# Patient Record
Sex: Male | Born: 1990 | Race: Black or African American | Hispanic: No | Marital: Single | Smoking: Current every day smoker
Health system: Southern US, Community
[De-identification: ages and names within clinical notes are randomized; demographics above are authoritative.]

---

## 2011-11-01 ENCOUNTER — Emergency Department (HOSPITAL_COMMUNITY): Payer: Federal, State, Local not specified - PPO

## 2011-11-01 ENCOUNTER — Encounter (HOSPITAL_COMMUNITY): Payer: Self-pay | Admitting: *Deleted

## 2011-11-01 ENCOUNTER — Emergency Department (HOSPITAL_COMMUNITY)
Admission: EM | Admit: 2011-11-01 | Discharge: 2011-11-01 | Disposition: A | Discharge status: Home / Self Care | Payer: Federal, State, Local not specified - PPO | Attending: Emergency Medicine | Admitting: Emergency Medicine

## 2011-11-01 DIAGNOSIS — J45909 Unspecified asthma, uncomplicated: Secondary | ICD-10-CM | POA: Insufficient documentation

## 2011-11-01 DIAGNOSIS — R079 Chest pain, unspecified: Secondary | ICD-10-CM | POA: Insufficient documentation

## 2011-11-01 DIAGNOSIS — J40 Bronchitis, not specified as acute or chronic: Secondary | ICD-10-CM

## 2011-11-01 DIAGNOSIS — R0602 Shortness of breath: Secondary | ICD-10-CM | POA: Insufficient documentation

## 2011-11-01 MED ORDER — PREDNISONE 50 MG PO TABS: 50.0000 mg | ORAL_TABLET | Freq: Every day | ORAL | Status: DC

## 2011-11-01 MED ORDER — ALBUTEROL SULFATE HFA 108 (90 BASE) MCG/ACT IN AERS
2.0000 | INHALATION_SPRAY | Freq: Once | RESPIRATORY_TRACT | Status: AC
  Administered 2011-11-01: 2 via RESPIRATORY_TRACT
  Filled 2011-11-01: qty 6.7

## 2011-11-01 MED ORDER — PREDNISONE 20 MG PO TABS
60.0000 mg | ORAL_TABLET | Freq: Once | ORAL | Status: AC
  Administered 2011-11-01: 60 mg via ORAL
  Filled 2011-11-01: qty 3

## 2011-11-01 NOTE — ED Provider Notes (Signed)
Medical screening examination/treatment/procedure(s) were performed by non-physician practitioner and as supervising physician I was immediately available for consultation/collaboration.  Shanti Eichel T Drew Lips, MD 11/01/11 2325 

## 2011-11-01 NOTE — ED Provider Notes (Signed)
History     CSN: [id number]  Arrival date & time 11/01/11  4098   First MD Initiated Contact with Patient 11/01/11 2107      Chief Complaint  Patient presents with  . Chest Pain    started on z pack Friday,  says he just don't feel good,  coughing up blood tinged yellow phelgm, hurts to take deep breath and cough,    (Consider location/radiation/quality/duration/timing/severity/associated sxs/prior treatment) Patient is a 21 y.o. male presenting with cough. The history is provided by the patient.  Cough This is a new problem. The current episode started more than 1 week ago. The problem occurs constantly. The problem has not changed since onset.The cough is productive of sputum. Associated symptoms include chest pain, chills, sweats, shortness of breath and wheezing. Pertinent negatives include no sore throat.  Pt states he went to see his school NP and was given a prescription for z-pack. States he took it for 2 days now and it is not improving. Not taking any other medications. Subjective fevers at home, nasal congestion. Chest pain with coughing, today saw blood in sputum.  Past Medical History  Diagnosis Date  . Asthma     No past surgical history on file.  No family history on file.  History  Substance Use Topics  . Smoking status: Not on file  . Smokeless tobacco: Not on file  . Alcohol Use: No      Review of Systems  Constitutional: Positive for chills.  HENT: Positive for congestion. Negative for sore throat, neck pain and neck stiffness.   Eyes: Negative.   Respiratory: Positive for cough, shortness of breath and wheezing.   Cardiovascular: Positive for chest pain.  Gastrointestinal: Negative.   Genitourinary: Negative.   Musculoskeletal: Negative.   Skin: Negative.   Neurological: Negative.   Psychiatric/Behavioral: Negative.     Allergies  Review of patient's allergies indicates no known allergies.  Home Medications   Current Outpatient Rx  Name  Route Sig Dispense Refill  . LEVALBUTEROL HCL 1.25 MG/0.5ML IN NEBU Nebulization Take 1 ampule by nebulization every 4 (four) hours as needed. Wheezing      BP 130/62  Pulse 76  Temp(Src) 98.2 F (36.8 C) (Oral)  Resp 20  SpO2 99%  Physical Exam  Nursing note and vitals reviewed. Constitutional: He is oriented to person, place, and time. He appears well-developed and well-nourished. No distress.  HENT:  Head: Normocephalic.  Eyes: Conjunctivae are normal.  Neck: Neck supple.  Cardiovascular: Normal rate, regular rhythm and normal heart sounds.   Pulmonary/Chest: Effort normal. He has wheezes.       Expiratory wheezes in all lung fields  Musculoskeletal: Normal range of motion. He exhibits no edema.  Neurological: He is alert and oriented to person, place, and time.  Skin: Skin is warm and dry.  Psychiatric: He has a normal mood and affect.    ED Course  Procedures (including critical care time)  Labs Reviewed - No data to display Dg Chest 2 View  11/01/2011  *RADIOLOGY REPORT*  Clinical Data: Shortness of breath and chest pain; history of smoking.  CHEST - 2 VIEW  Comparison: None.  Findings: The lungs are well-aerated.  Peribronchial thickening is noted.  There is no evidence of focal opacification, pleural effusion or pneumothorax.  The heart is normal in size; the mediastinal contour is within normal limits.  No acute osseous abnormalities are seen.  IMPRESSION: Peribronchial thickening noted; lungs otherwise clear.  Original Report Authenticated By:  JEFFREY Cherly Hensen, M.D.   PT with cough, wheezing for last week. Started on z-pack 2 days ago. His vital signs are normal. His oxygen sat 99% on RA. He is afebrile. HR normal. Suspect bronchitis. Will treat with inhaler, steroids, continue on z-pack. Will follow up with primary care doctor.    No diagnosis found.    MDM          Lottie Mussel, PA 11/01/11 2204

## 2011-11-01 NOTE — Discharge Instructions (Signed)
Your chest x-ray is consistent with bronchitis. Your vital signs here are all normal. Continue z-pack. Take it with food. Take prednisone as prescribed until all gone. Use inhaler 2 puffs every 4 hrs. Stop smoking. OVer the counter tylenol for body aches and fever. Over the counter cough medications.  Follow up with primary care doctor.   Bronchitis Bronchitis is the body's way of reacting to injury and/or infection (inflammation) of the bronchi. Bronchi are the air tubes that extend from the windpipe into the lungs. If the inflammation becomes severe, it may cause shortness of breath. CAUSES  Inflammation may be caused by:  A virus.   Germs (bacteria).   Dust.   Allergens.   Pollutants and many other irritants.  The cells lining the bronchial tree are covered with tiny hairs (cilia). These constantly beat upward, away from the lungs, toward the mouth. This keeps the lungs free of pollutants. When these cells become too irritated and are unable to do their job, mucus begins to develop. This causes the characteristic cough of bronchitis. The cough clears the lungs when the cilia are unable to do their job. Without either of these protective mechanisms, the mucus would settle in the lungs. Then you would develop pneumonia. Smoking is a common cause of bronchitis and can contribute to pneumonia. Stopping this habit is the single most important thing you can do to help yourself. TREATMENT   Your caregiver may prescribe an antibiotic if the cough is caused by bacteria. Also, medicines that open up your airways make it easier to breathe. Your caregiver may also recommend or prescribe an expectorant. It will loosen the mucus to be coughed up. Only take over-the-counter or prescription medicines for pain, discomfort, or fever as directed by your caregiver.   Removing whatever causes the problem (smoking, for example) is critical to preventing the problem from getting worse.   Cough suppressants may  be prescribed for relief of cough symptoms.   Inhaled medicines may be prescribed to help with symptoms now and to help prevent problems from returning.   For those with recurrent (chronic) bronchitis, there may be a need for steroid medicines.  SEEK IMMEDIATE MEDICAL CARE IF:   During treatment, you develop more pus-like mucus (purulent sputum).   You have a fever.   Your baby is older than 3 months with a rectal temperature of 102 F (38.9 C) or higher.   Your baby is 80 months old or younger with a rectal temperature of 100.4 F (38 C) or higher.   You become progressively more ill.   You have increased difficulty breathing, wheezing, or shortness of breath.  It is necessary to seek immediate medical care if you are elderly or sick from any other disease. MAKE SURE YOU:   Understand these instructions.   Will watch your condition.   Will get help right away if you are not doing well or get worse.  Document Released: 07/27/2005 Document Revised: 07/16/2011 Document Reviewed: 06/05/2008 [facility] Patient Information 2012 [place], [place].

## 2011-11-01 NOTE — ED Notes (Signed)
Pt placed on z pack, doesn't know if he was diagnosed with infection,  Says he is not getting better,  Continued chest congestion, and cough

## 2013-03-16 ENCOUNTER — Encounter (HOSPITAL_COMMUNITY): Payer: Self-pay | Admitting: *Deleted

## 2013-03-16 ENCOUNTER — Emergency Department (HOSPITAL_COMMUNITY)
Admission: EM | Admit: 2013-03-16 | Discharge: 2013-03-16 | Disposition: A | Discharge status: Home / Self Care | Payer: Federal, State, Local not specified - PPO | Source: Home / Self Care

## 2013-03-16 DIAGNOSIS — J039 Acute tonsillitis, unspecified: Secondary | ICD-10-CM

## 2013-03-16 DIAGNOSIS — H6692 Otitis media, unspecified, left ear: Secondary | ICD-10-CM

## 2013-03-16 DIAGNOSIS — H6690 Otitis media, unspecified, unspecified ear: Secondary | ICD-10-CM

## 2013-03-16 MED ORDER — METHYLPREDNISOLONE ACETATE 40 MG/ML IJ SUSP
40.0000 mg | Freq: Once | INTRAMUSCULAR | Status: AC
  Administered 2013-03-16: 40 mg via INTRAMUSCULAR

## 2013-03-16 MED ORDER — METHYLPREDNISOLONE ACETATE 40 MG/ML IJ SUSP
INTRAMUSCULAR | Status: AC
  Filled 2013-03-16: qty 5

## 2013-03-16 MED ORDER — AMOXICILLIN-POT CLAVULANATE 875-125 MG PO TABS: 1.0000 | ORAL_TABLET | Freq: Two times a day (BID) | ORAL | Status: DC

## 2013-03-16 MED ORDER — HYDROCODONE-ACETAMINOPHEN 5-325 MG PO TABS: 1.0000 | ORAL_TABLET | Freq: Three times a day (TID) | ORAL | Status: DC | PRN

## 2013-03-16 NOTE — ED Notes (Signed)
Pt  Reports  Symptoms  Of  sorethroat  With  Pain  When he  Swallows  As  Well  As  l  Earache      Symptoms  X  3  Days       Good  Air  Exchange  Cap  refill is  Brisk

## 2013-03-18 LAB — CULTURE, GROUP A STREP

## 2013-03-21 NOTE — ED Provider Notes (Signed)
CSN: [id number]     Arrival date & time 03/16/13  0844 History     First MD Initiated Contact with Patient 03/16/13 [phone]     Chief Complaint  Patient presents with  . Sore Throat   (Consider location/radiation/quality/duration/timing/severity/associated sxs/prior Treatment) HPI  22 yo bm presented with sore throat and left ear pain.  Symptoms ongoing x 3 days and states that he was feeling fine before sudden onset.  Ear pain aggravated when he tilts his head.  No ear drainage.  Sore throat worsening.  Pain with swallowing, but denies dysphagia or dyspnea.  No complaints of fever, chill, nausea, vomiting, rashes.    Past Medical History  Diagnosis Date  . Asthma    History reviewed. No pertinent past surgical history. No family history on file. History  Substance Use Topics  . Smoking status: Not on file  . Smokeless tobacco: Not on file  . Alcohol Use: No    Review of Systems  Constitutional: Positive for appetite change. Negative for fever, diaphoresis and fatigue.  HENT: Positive for ear pain and neck pain. Negative for hearing loss, nosebleeds, sneezing, neck stiffness, tinnitus and ear discharge.   Eyes: Negative.   Respiratory: Positive for cough. Negative for apnea, chest tightness, shortness of breath, wheezing and stridor.   Cardiovascular: Negative.   Gastrointestinal: Negative.   Endocrine: Negative.   Genitourinary: Negative.   Skin: Negative.   Neurological: Negative.   Psychiatric/Behavioral: Negative.     Allergies  Review of patient's allergies indicates no known allergies.  Home Medications   Current Outpatient Rx  Name  Route  Sig  Dispense  Refill  . amoxicillin-clavulanate (AUGMENTIN) 875-125 MG per tablet   Oral   Take 1 tablet by mouth every 12 (twelve) hours.   20 tablet   0   . HYDROcodone-acetaminophen (NORCO) 5-325 MG per tablet   Oral   Take 1 tablet by mouth every 8 (eight) hours as needed for pain.   20 tablet   0   . levalbuterol  (XOPENEX) 1.25 MG/0.5ML nebulizer solution   Nebulization   Take 1 ampule by nebulization every 4 (four) hours as needed. Wheezing         . predniSONE (DELTASONE) 50 MG tablet   Oral   Take 1 tablet (50 mg total) by mouth daily.   5 tablet   0    BP 141/78  Pulse 97  Temp(Src) 99.5 F (37.5 C) (Oral)  Resp 20  SpO2 100% Physical Exam  Constitutional: He is oriented to person, place, and time. He appears well-nourished.  HENT:  Head: Normocephalic and atraumatic.  Left otitis media with small effusion.  TM intact. Left tonsil red and swollen. No abscess.   No drainage.   Eyes: EOM are normal. Pupils are equal, round, and reactive to light.  Pulmonary/Chest: Effort normal and breath sounds normal. No respiratory distress. He has no wheezes. He has no rales.  Lymphadenopathy:    He has cervical adenopathy.  Neurological: He is alert and oriented to person, place, and time.  Skin: Skin is warm and dry.  Psychiatric: He has a normal mood and affect.    ED Course   Procedures (including critical care time)  Labs Reviewed  CULTURE, GROUP A STREP  POCT RAPID STREP A ([facility] ONLY)   No results found. 1. Otitis media, left   2. Tonsillitis     MDM  Patient given medication listed below.  Will return in 3-5 days if  not better or go to the ED if symptoms worsen.  Voices understanding.  Discharge instructions given and understands what to look for in worsening symptoms.    Meds ordered this encounter  Medications  . methylPREDNISolone acetate (DEPO-MEDROL) injection 40 mg    Sig:   . amoxicillin-clavulanate (AUGMENTIN) 875-125 MG per tablet    Sig: Take 1 tablet by mouth every 12 (twelve) hours.    Dispense:  20 tablet    Refill:  0  . HYDROcodone-acetaminophen (NORCO) 5-325 MG per tablet    Sig: Take 1 tablet by mouth every 8 (eight) hours as needed for pain.    Dispense:  20 tablet    Refill:  0    Zonia Kief, PA-C 03/21/13 1634

## 2013-03-25 NOTE — ED Provider Notes (Signed)
Medical screening examination/treatment/procedure(s) were performed by resident physician or non-physician practitioner and as supervising physician I was immediately available for consultation/collaboration.   Bienvenido Proehl DOUGLAS MD.   Ricquel Foulk D Kyrstal Monterrosa, MD 03/25/13 0950 

## 2014-02-02 ENCOUNTER — Encounter (HOSPITAL_COMMUNITY): Payer: Self-pay | Admitting: Emergency Medicine

## 2014-02-02 ENCOUNTER — Emergency Department (HOSPITAL_COMMUNITY)
Admission: EM | Admit: 2014-02-02 | Discharge: 2014-02-03 | Disposition: A | Discharge status: Home / Self Care | Payer: Federal, State, Local not specified - PPO | Attending: Emergency Medicine | Admitting: Emergency Medicine

## 2014-02-02 DIAGNOSIS — Z23 Encounter for immunization: Secondary | ICD-10-CM | POA: Diagnosis not present

## 2014-02-02 DIAGNOSIS — S51809A Unspecified open wound of unspecified forearm, initial encounter: Secondary | ICD-10-CM | POA: Insufficient documentation

## 2014-02-02 DIAGNOSIS — Y929 Unspecified place or not applicable: Secondary | ICD-10-CM | POA: Insufficient documentation

## 2014-02-02 DIAGNOSIS — W268XXA Contact with other sharp object(s), not elsewhere classified, initial encounter: Secondary | ICD-10-CM | POA: Diagnosis not present

## 2014-02-02 DIAGNOSIS — S51811A Laceration without foreign body of right forearm, initial encounter: Secondary | ICD-10-CM

## 2014-02-02 DIAGNOSIS — J45909 Unspecified asthma, uncomplicated: Secondary | ICD-10-CM | POA: Diagnosis not present

## 2014-02-02 DIAGNOSIS — Y9389 Activity, other specified: Secondary | ICD-10-CM | POA: Insufficient documentation

## 2014-02-02 MED ORDER — TETANUS-DIPHTH-ACELL PERTUSSIS 5-2.5-18.5 LF-MCG/0.5 IM SUSP
0.5000 mL | Freq: Once | INTRAMUSCULAR | Status: AC
  Administered 2014-02-03: 0.5 mL via INTRAMUSCULAR
  Filled 2014-02-02: qty 0.5
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
## 2014-02-03 ENCOUNTER — Emergency Department (HOSPITAL_COMMUNITY): Payer: Federal, State, Local not specified - PPO

## 2014-02-03 MED ORDER — OXYCODONE-ACETAMINOPHEN 5-325 MG PO TABS: 1.0000 | ORAL_TABLET | Freq: Four times a day (QID) | ORAL | Status: DC | PRN

## 2014-02-03 NOTE — ED Notes (Signed)
Patient transported to X-ray 
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
## 2014-02-03 NOTE — ED Notes (Signed)
Pt walked out of room leaving. RN asked pt where he was going and called his name.  Pt continued to walk out.  Told pt we needed to recheck his vitals before he left and pt continued to walk out.

## 2014-02-03 NOTE — ED Notes (Signed)
Pt left without instructions and Rx.

## 2014-02-03 NOTE — Discharge Instructions (Signed)
Sutured Wound Care °Sutures are stitches that can be used to close wounds. Wound care helps prevent pain and infection.  °HOME CARE INSTRUCTIONS  °· Rest and elevate the injured area until all the pain and swelling are gone. °· Only take over-the-counter or prescription medicines for pain, discomfort, or fever as directed by your caregiver. °· After 48 hours, gently wash the area with mild soap and water once a day, or as directed. Rinse off the soap. Pat the area dry with a clean towel. Do not rub the wound. This may cause bleeding. °· Follow your caregiver's instructions for how often to change the bandage (dressing). Stop using a dressing after 2 days or after the wound stops draining. °· If the dressing sticks, moisten it with soapy water and gently remove it. °· Apply ointment on the wound as directed. °· Avoid stretching a sutured wound. °· Drink enough fluids to keep your urine clear or pale yellow. °· Follow up with your caregiver for suture removal as directed. °· Use sunscreen on your wound for the next 3 to 6 months so the scar will not darken. °SEEK IMMEDIATE MEDICAL CARE IF:  °· Your wound becomes red, swollen, hot, or tender. °· You have increasing pain in the wound. °· You have a red streak that extends from the wound. °· There is pus coming from the wound. °· You have a fever. °· You have shaking chills. °· There is a bad smell coming from the wound. °· You have persistent bleeding from the wound. °MAKE SURE YOU:  °· Understand these instructions. °· Will watch your condition. °· Will get help right away if you are not doing well or get worse. °Document Released: 09/03/2004 Document Revised: 10/19/2011 Document Reviewed: 11/30/2010 °[employer]® Patient Information ©2015 [employer]. This information is not intended to replace advice given to you by your health care provider. Make sure you discuss any questions you have with your health care provider. ° °
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
## 2014-02-03 NOTE — ED Provider Notes (Signed)
DAMMEN,PETER S 3:50AM Pt discussed with Attending.  Will assist in pt care with laceration repair.    LACERATION REPAIR Performed by: Angus SellerAMMEN,PETER S Authorized by: Angus SellerAMMEN,PETER S Consent: Verbal consent obtained. Risks and benefits: risks, benefits and alternatives were discussed Consent given by: patient Patient identity confirmed: provided demographic data Prepped and Draped in normal sterile fashion Wound explored  Laceration Location: right forearm  Laceration Length: 8cm  No Foreign Bodies seen or palpated  Anesthesia: local infiltration  Local anesthetic: lidocaine 2% with epinephrine  Anesthetic total: 8 ml  Irrigation method: syringe Amount of cleaning: standard  Closure: sub-q with 4-0 vicryl, skin with 3-0 prolene, skin with 5-0 prolene  Number of sutures: 19  Technique: simple interrupted  Patient tolerance: Patient tolerated the procedure well with no immediate complications.   Angus SellerPeter S Dammen, PA-C 02/03/14 [phone]

## 2014-02-03 NOTE — ED Notes (Signed)
Lorita OfficerPeter PA-C at bedside for suturing

## 2014-02-03 NOTE — ED Provider Notes (Signed)
Medical screening examination/treatment/procedure(s) were performed by non-physician practitioner and as supervising physician I was immediately available for consultation/collaboration.   EKG Interpretation None       Juliet RudeNathan R. Rubin PayorPickering, MD 02/03/14 [phone]

## 2014-02-04 ENCOUNTER — Encounter (HOSPITAL_COMMUNITY): Payer: Self-pay | Admitting: Emergency Medicine

## 2014-02-04 ENCOUNTER — Emergency Department (INDEPENDENT_AMBULATORY_CARE_PROVIDER_SITE_OTHER)
Admission: EM | Admit: 2014-02-04 | Discharge: 2014-02-04 | Disposition: A | Discharge status: Home / Self Care | Payer: Federal, State, Local not specified - PPO | Source: Home / Self Care

## 2014-02-04 DIAGNOSIS — M79609 Pain in unspecified limb: Secondary | ICD-10-CM

## 2014-02-04 DIAGNOSIS — Z5189 Encounter for other specified aftercare: Secondary | ICD-10-CM

## 2014-02-04 DIAGNOSIS — M79601 Pain in right arm: Secondary | ICD-10-CM
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
## 2014-02-04 NOTE — ED Notes (Signed)
Patient presents for follow-up on arm laceration. Was seen in ER and Left without being discharged. Did not receive instructions. Patient is alert and oriented and in no acute distress.

## 2014-02-04 NOTE — Discharge Instructions (Signed)
Please keep the area covered with vaseline and a bandage. You should have your sutures out in 5 days. If you develop worsening pain or redness then please come back sooner.   I hope you feel better soon.   Sincerely,   Dr. Clinton SawyerWilliamson

## 2014-02-07 NOTE — ED Provider Notes (Signed)
Medical screening examination/treatment/procedure(s) were performed by a resident physician and as supervising physician I was immediately available for consultation/collaboration.  Leslee Homeavid Keller, M.D.   Reuben Likesavid C Keller, MD 02/07/14 [phone]

## 2014-02-10 ENCOUNTER — Encounter (HOSPITAL_COMMUNITY): Payer: Self-pay | Admitting: Emergency Medicine

## 2014-02-10 ENCOUNTER — Emergency Department (INDEPENDENT_AMBULATORY_CARE_PROVIDER_SITE_OTHER)
Admission: EM | Admit: 2014-02-10 | Discharge: 2014-02-10 | Disposition: A | Discharge status: Home / Self Care | Payer: Federal, State, Local not specified - PPO | Source: Home / Self Care

## 2014-02-10 DIAGNOSIS — Z4802 Encounter for removal of sutures: Secondary | ICD-10-CM

## 2014-02-10 NOTE — ED Provider Notes (Signed)
CSN: [id number]     Arrival date & time 02/10/14  0909 History   First MD Initiated [id number] with Patient 02/10/14 [phone]     Chief Complaint  Patient presents with  . Wound Check   (Consider location/radiation/quality/duration/timing/severity/associated sxs/prior Treatment) HPI Comments: For suture removal and wound chk. Suffered multipronged laceration to the Right forearm from broken glass 7 d ago. See in the ED and closed with 19 sutures, some deep closure absorbable.  Patient is a 23 y.o. male presenting with wound check.  Wound Check    Past Medical History  Diagnosis Date  . Asthma    History reviewed. No pertinent past surgical history. History reviewed. No pertinent family history. History  Substance Use Topics  . Smoking status: Unknown If Ever Smoked  . Smokeless tobacco: Not on file  . Alcohol Use: No    Review of Systems  All other systems reviewed and are negative.   Allergies  Review of patient's allergies indicates no known allergies.  Home Medications   Prior to Admission medications   Medication Sig Start Date End Date Taking? Authorizing Provider  oxyCODONE-acetaminophen (PERCOCET/ROXICET) 5-325 MG per tablet Take 1-2 tablets by mouth every 6 (six) hours as needed for severe pain. 02/03/14   Juliet RudeNathan R. Pickering, MD   BP 120/77  Pulse 87  Temp(Src) 97.8 F (36.6 C) (Oral)  Resp 16  SpO2 95% Physical Exam  Constitutional: He is oriented to person, place, and time. He appears well-developed and well-nourished. No distress.  Neurological: He is alert and oriented to person, place, and time.  Skin: Skin is warm and dry.  SUtures intact. No sign of infection. Edges well approximated. No purulence. Mild swelling beneath the wounds. Minimal light hyperemia to wound edges.    ED Course  SUTURE REMOVAL Date/Time: 02/10/2014 9:45 AM Performed by: Phineas RealMABE, Louvina Cleary Authorized by: Bradd CanaryKINDL, JAMES D Consent: Verbal consent obtained. Risks and benefits: risks, benefits  and alternatives were discussed Consent given by: patient Patient understanding: patient states understanding of the procedure being performed Patient identity confirmed: verbally with patient Body area: upper extremity Location details: right lower arm Wound Appearance: clean Sutures Removed: 10 Post-removal: dressing applied Patient tolerance: Patient tolerated the procedure well with no immediate complications. Comments: There are 4 inline sutures not removed due to partial separation of the edges. Will leave in for another 3 days. Cleaned with betadine.   (including critical care time) Labs Review Labs Reviewed - No data to display  Imaging Review No results found.   MDM   1. Visit for suture removal     Return 3 d for suture removal.       Hayden Rasmussenavid Tyliek Timberman, NP 02/10/14 [phone]

## 2014-02-10 NOTE — ED Notes (Signed)
Remaining sutures to be removed on Tuesday. Wrapped wound in ACE bandage w bacitracin oint to non stick pad

## 2014-02-10 NOTE — ED Notes (Addendum)
Here for wound check, suture removal. Initial visit 6-26 in ED for laceration. minimal redness at wound

## 2014-02-10 NOTE — ED Provider Notes (Signed)
Medical screening examination/treatment/procedure(s) were performed by resident physician or non-physician practitioner and as supervising physician I was immediately available for consultation/collaboration.   KINDL,JAMES DOUGLAS MD.   James D Kindl, MD 02/10/14 1550 

## 2014-02-10 NOTE — Discharge Instructions (Signed)
Suture Removal, Care After °Refer to this sheet in the next few weeks. These instructions provide you with information on caring for yourself after your procedure. Your health care provider may also give you more specific instructions. Your treatment has been planned according to current medical practices, but problems sometimes occur. Call your health care provider if you have any problems or questions after your procedure. °WHAT TO EXPECT AFTER THE PROCEDURE °After your stitches (sutures) are removed, it is typical to have the following: °· Some discomfort and swelling in the wound area. °· Slight redness in the area. °HOME CARE INSTRUCTIONS  °· If you have skin adhesive strips over the wound area, do not take the strips off. They will fall off on their own in a few days. If the strips remain in place after 14 days, you may remove them. °· Change any bandages (dressings) at least once a day or as directed by your health care provider. If the bandage sticks, soak it off with warm, soapy water. °· Apply cream or ointment only as directed by your health care provider. If using cream or ointment, wash the area with soap and water 2 times a day to remove all the cream or ointment. Rinse off the soap and pat the area dry with a clean towel. °· Keep the wound area dry and clean. If the bandage becomes wet or dirty, or if it develops a bad smell, change it as soon as possible. °· Continue to protect the wound from injury. °· Use sunscreen when out in the sun. New scars become sunburned easily. °SEEK MEDICAL CARE IF: °· You have increasing redness, swelling, or pain in the wound. °· You see pus coming from the wound. °· You have a fever. °· You notice a bad smell coming from the wound or dressing. °· Your wound breaks open (edges not staying together). °Document Released: 04/21/2001 Document Revised: 05/17/2013 Document Reviewed: 03/08/2013 °[employer]® Patient Information ©2015 [employer]. This information is not  intended to replace advice given to you by your health care provider. Make sure you discuss any questions you have with your health care provider. ° °Scar Minimization °You will have a scar anytime you have surgery and a cut is made in the skin or you have something removed from your skin (mole, skin cancer, cyst). Although scars are unavoidable following surgery, there are ways to minimize their appearance. °It is important to follow all the instructions you receive from your caregiver about wound care. How your wound heals will influence the appearance of your scar. If you do not follow the wound care instructions as directed, complications such as infection may occur. Wound instructions include keeping the wound clean, moist, and not letting the wound form a scab. Some people form scars that are raised and lumpy (hypertrophic) or larger than the initial wound (keloidal). °HOME CARE INSTRUCTIONS  °· Follow wound care instructions as directed. °· Keep the wound clean by washing it with soap and water. °· Keep the wound moist with provided antibiotic cream or petroleum jelly until completely healed. Moisten twice a day for about 2 weeks. °· Get stitches (sutures) taken out at the scheduled time. °· Avoid touching or manipulating your wound unless needed. Wash your hands thoroughly before and after touching your wound. °· Follow all restrictions such as limits on exercise or work. This depends on where your scar is located. °· Keep the scar protected from sunburn. Cover the scar with sunscreen/sunblock with SPF 30 or higher. °·   Gently massage the scar using a circular motion to help minimize the appearance of the scar. Do this only after the wound has closed and all the sutures have been removed. °· For hypertrophic or keloidal scars, there are several ways to treat and minimize their appearance. Methods include compression therapy, intralesional corticosteroids, laser therapy, or surgery. These methods are performed  by your caregiver. °Remember that the scar may appear lighter or darker than your normal skin color. This difference in color should even out with time. °SEEK MEDICAL CARE IF:  °· You have a fever. °· You develop signs of infection such as pain, redness, pus, and warmth. °· You have questions or concerns. °Document Released: 01/14/2010 Document Revised: 10/19/2011 Document Reviewed: 01/14/2010 °[employer]® Patient Information ©2015 [employer]. This information is not intended to replace advice given to you by your health care provider. Make sure you discuss any questions you have with your health care provider. ° °

## 2014-02-13 ENCOUNTER — Encounter (HOSPITAL_COMMUNITY): Payer: Self-pay | Admitting: Emergency Medicine

## 2014-02-13 ENCOUNTER — Emergency Department (INDEPENDENT_AMBULATORY_CARE_PROVIDER_SITE_OTHER)
Admission: EM | Admit: 2014-02-13 | Discharge: 2014-02-13 | Disposition: A | Discharge status: Home / Self Care | Payer: Federal, State, Local not specified - PPO | Source: Home / Self Care | Attending: Family Medicine | Admitting: Family Medicine

## 2014-02-13 DIAGNOSIS — Z4802 Encounter for removal of sutures: Secondary | ICD-10-CM

## 2014-02-13 NOTE — Discharge Instructions (Signed)
Return as needed

## 2014-02-13 NOTE — ED Notes (Signed)
Here to get remaining suture removed from right forearm States area feels great  Denies any discharge

## 2014-02-13 NOTE — ED Provider Notes (Signed)
CSN: [id number]     Arrival date & time 02/13/14  [id number] History   First MD Initiated Contact with Patient 02/13/14 [phone]     Chief Complaint  Patient presents with  . Suture / Staple Removal   (Consider location/radiation/quality/duration/timing/severity/associated sxs/prior Treatment) Patient is a 23 y.o. male presenting with suture removal. The history is provided by the patient.  Suture / Staple Removal This is a new problem. Episode onset: seen 7/4 for sr of right forearm lac, no complaints, well healed.    Past Medical History  Diagnosis Date  . Asthma    History reviewed. No pertinent past surgical history. History reviewed. No pertinent family history. History  Substance Use Topics  . Smoking status: Unknown If Ever Smoked  . Smokeless tobacco: Not on file  . Alcohol Use: No    Review of Systems  Allergies  Review of patient's allergies indicates no known allergies.  Home Medications   Prior to Admission medications   Medication Sig Start Date End Date Taking? Authorizing Provider  oxyCODONE-acetaminophen (PERCOCET/ROXICET) 5-325 MG per tablet Take 1-2 tablets by mouth every 6 (six) hours as needed for severe pain. 02/03/14   Juliet RudeNathan R. Pickering, MD   BP 118/79  Temp(Src) 97.5 F (36.4 C) (Oral)  Resp 16  Ht 5\' 9"  (1.753 m)  Wt 150 lb (68.04 kg)  BMI 22.14 kg/m2  SpO2 97% Physical Exam  Nursing note and vitals reviewed. Constitutional: He is oriented to person, place, and time. He appears well-developed and well-nourished.  Neurological: He is alert and oriented to person, place, and time.  Skin: Skin is warm and dry.  Right arm lac well  Healed, 4 stitches removed.    ED Course  Procedures (including critical care time) Labs Review Labs Reviewed - No data to display  Imaging Review No results found.   MDM   1. Encounter for removal of sutures        Linna HoffJames D Tristan Proto, MD 02/13/14 1125

## 2014-12-06 ENCOUNTER — Emergency Department (INDEPENDENT_AMBULATORY_CARE_PROVIDER_SITE_OTHER)
Admission: EM | Admit: 2014-12-06 | Discharge: 2014-12-06 | Disposition: A | Discharge status: Home / Self Care | Payer: Federal, State, Local not specified - PPO | Source: Home / Self Care | Attending: Emergency Medicine | Admitting: Emergency Medicine

## 2014-12-06 ENCOUNTER — Encounter (HOSPITAL_COMMUNITY): Payer: Self-pay | Admitting: *Deleted

## 2014-12-06 DIAGNOSIS — H6092 Unspecified otitis externa, left ear: Secondary | ICD-10-CM

## 2014-12-06 DIAGNOSIS — H6982 Other specified disorders of Eustachian tube, left ear: Secondary | ICD-10-CM

## 2014-12-06 MED ORDER — CIPROFLOXACIN-DEXAMETHASONE 0.3-0.1 % OT SUSP: 4.0000 [drp] | Freq: Two times a day (BID) | OTIC | Status: AC

## 2014-12-06 MED ORDER — FLUTICASONE PROPIONATE 50 MCG/ACT NA SUSP: 2.0000 | Freq: Every day | NASAL | Status: AC

## 2014-12-06 MED ORDER — CETIRIZINE HCL 10 MG PO TABS: 10.0000 mg | ORAL_TABLET | Freq: Every day | ORAL | Status: AC

## 2014-12-06 NOTE — ED Notes (Signed)
Left ear pain for 3 days, pain to the left side of head, cough.  Denies placing anything in left ear.  Pain in ear with swallowing.

## 2014-12-06 NOTE — ED Notes (Deleted)
Pt  Reports  She  Has  Had  A  Ringing in  Both   Ears   For  About  6  Months      She  Reports     Pressure  Sensation in the affected  Ears     That became  Worse  yest     Pt is  Sitting  pright on the  Exam table  Speaking in  Complete  sentances  And  Is  In no  Acute  Distress

## 2014-12-06 NOTE — Discharge Instructions (Signed)
You have an infection in the left ear canal. Your ear is also not draining like it's supposed to. Takes cetirizine 1 pill daily for 1 week. Use Flonase daily for 1 week. Use the eardrops in the left ear twice a day for 1 week. You can alternate Tylenol and ibuprofen every 4 hours for pain. You should see improvement in 2-3 days. Follow-up as needed.

## 2014-12-06 NOTE — ED Provider Notes (Signed)
CSN: [id number]     Arrival date & time 12/06/14  1100 History   First MD Initiated Contact with Patient 12/06/14 1302     Chief Complaint  Patient presents with  . Otalgia   (Consider location/radiation/quality/duration/timing/severity/associated sxs/prior Treatment) HPI He is a [age] year old man here for evaluation of left ear pain. It started 3 days ago and has gradually been getting worse. The pain is spreading into the left side of his head. He has some discomfort in the left side of his throat with swallowing. He has not tried any medications. No fevers. He denies any runny nose or stuffiness.  Past Medical History  Diagnosis Date  . Asthma    History reviewed. No pertinent past surgical history. History reviewed. No pertinent family history. History  Substance Use Topics  . Smoking status: Unknown If Ever Smoked  . Smokeless tobacco: Not on file  . Alcohol Use: No    Review of Systems As in history of present illness Allergies  Review of patient's allergies indicates no known allergies.  Home Medications   Prior to Admission medications   Medication Sig Start Date End Date Taking? Authorizing Provider  cetirizine (ZYRTEC) 10 MG tablet Take 1 tablet (10 mg total) by mouth daily. 12/06/14   Charm RingsErin J Tekla Malachowski, MD  ciprofloxacin-dexamethasone (CIPRODEX) otic suspension Place 4 drops into the left ear 2 (two) times daily. For 7 days 12/06/14   Charm RingsErin J Adalbert Alberto, MD  fluticasone [facility](FLONASE) 50 MCG/ACT nasal spray Place 2 sprays into both nostrils daily. 12/06/14   Charm RingsErin J Dare Spillman, MD   BP 138/80 mmHg  Pulse 68  Temp(Src) 97.7 F (36.5 C) (Oral)  Resp 18  SpO2 99% Physical Exam  Constitutional: He is oriented to person, place, and time. He appears well-developed and well-nourished. No distress.  HENT:  Right Ear: Tympanic membrane normal.  Left Ear: There is swelling (Of ear canal). Tympanic membrane is retracted. A middle ear effusion (clear fluid) is present.  Nose: Mucosal edema  present. No rhinorrhea.  Mouth/Throat: No oropharyngeal exudate.    Eyes: Conjunctivae are normal.  Neck: Neck supple.  Cardiovascular: Normal rate.   Pulmonary/Chest: Effort normal.  Neurological: He is alert and oriented to person, place, and time.    ED Course  Procedures (including critical care time) Labs Review Labs Reviewed - No data to display  Imaging Review No results found.   MDM   1. Eustachian tube dysfunction, left   2. Otitis externa, left    Treat with Zyrtec, Flonase, Ciprodex eardrops. Follow-up if no improvement in 1 week.    Charm RingsErin J Barlow Harrison, MD 12/06/14 [phone]

## 2014-12-24 ENCOUNTER — Emergency Department (INDEPENDENT_AMBULATORY_CARE_PROVIDER_SITE_OTHER)
Admission: EM | Admit: 2014-12-24 | Discharge: 2014-12-24 | Disposition: A | Discharge status: Home / Self Care | Payer: Federal, State, Local not specified - PPO | Source: Home / Self Care | Attending: Family Medicine | Admitting: Family Medicine

## 2014-12-24 ENCOUNTER — Encounter (HOSPITAL_COMMUNITY): Payer: Self-pay | Admitting: Emergency Medicine

## 2014-12-24 DIAGNOSIS — J029 Acute pharyngitis, unspecified: Secondary | ICD-10-CM

## 2014-12-24 DIAGNOSIS — J301 Allergic rhinitis due to pollen: Secondary | ICD-10-CM | POA: Diagnosis not present

## 2014-12-24 DIAGNOSIS — J4531 Mild persistent asthma with (acute) exacerbation: Secondary | ICD-10-CM

## 2014-12-24 LAB — POCT RAPID STREP A: STREPTOCOCCUS, GROUP A SCREEN (DIRECT): NEGATIVE

## 2014-12-24 MED ORDER — PREDNISONE 20 MG PO TABS: ORAL_TABLET | ORAL | Status: DC

## 2014-12-24 MED ORDER — ALBUTEROL SULFATE HFA 108 (90 BASE) MCG/ACT IN AERS: 2.0000 | INHALATION_SPRAY | RESPIRATORY_TRACT | Status: AC | PRN

## 2014-12-24 MED ORDER — AMOXICILLIN 500 MG PO CAPS: 1000.0000 mg | ORAL_CAPSULE | Freq: Two times a day (BID) | ORAL | Status: DC

## 2014-12-24 NOTE — Discharge Instructions (Signed)
Allergic Rhinitis Use Flonase nasal spray Zyrtec 10 mg daily Drink plenty of fluids stay well-hydrated Sudafed PE 10 mg every 4 hours as needed for congestion Prednisone and albuterol HFA as prescribed Allergic rhinitis is when the mucous membranes in the nose respond to allergens. Allergens are particles in the air that cause your body to have an allergic reaction. This causes you to release allergic antibodies. Through a chain of events, these eventually cause you to release histamine into the blood stream. Although meant to protect the body, it is this release of histamine that causes your discomfort, such as frequent sneezing, congestion, and an itchy, runny nose.  CAUSES  Seasonal allergic rhinitis (hay fever) is caused by pollen allergens that may come from grasses, trees, and weeds. Year-round allergic rhinitis (perennial allergic rhinitis) is caused by allergens such as house dust mites, pet dander, and mold spores.  SYMPTOMS   Nasal stuffiness (congestion).  Itchy, runny nose with sneezing and tearing of the eyes. DIAGNOSIS  Your health care provider can help you determine the allergen or allergens that trigger your symptoms. If you and your health care provider are unable to determine the allergen, skin or blood testing may be used. TREATMENT  Allergic rhinitis does not have a cure, but it can be controlled by:  Medicines and allergy shots (immunotherapy).  Avoiding the allergen. Hay fever may often be treated with antihistamines in pill or nasal spray forms. Antihistamines block the effects of histamine. There are over-the-counter medicines that may help with nasal congestion and swelling around the eyes. Check with your health care provider before taking or giving this medicine.  If avoiding the allergen or the medicine prescribed do not work, there are many new medicines your health care provider can prescribe. Stronger medicine may be used if initial measures are ineffective.  Desensitizing injections can be used if medicine and avoidance does not work. Desensitization is when a patient is given ongoing shots until the body becomes less sensitive to the allergen. Make sure you follow up with your health care provider if problems continue. HOME CARE INSTRUCTIONS It is not possible to completely avoid allergens, but you can reduce your symptoms by taking steps to limit your exposure to them. It helps to know exactly what you are allergic to so that you can avoid your specific triggers. SEEK MEDICAL CARE IF:   You have a fever.  You develop a cough that does not stop easily (persistent).  You have shortness of breath.  You start wheezing.  Symptoms interfere with normal daily activities. Document Released: 04/21/2001 Document Revised: 08/01/2013 Document Reviewed: 04/03/2013 [facility] Patient Information 2015 [place], [place]. This information is not intended to replace advice given to you by your health care provider. Make sure you discuss any questions you have with your health care provider.  Asthma, Acute Bronchospasm Acute bronchospasm caused by asthma is also referred to as an asthma attack. Bronchospasm means your air passages become narrowed. The narrowing is caused by inflammation and tightening of the muscles in the air tubes (bronchi) in your lungs. This can make it hard to breathe or cause you to wheeze and cough. CAUSES Possible triggers are:  Animal dander from the skin, hair, or feathers of animals.  Dust mites contained in house dust.  Cockroaches.  Pollen from trees or grass.  Mold.  Cigarette or tobacco smoke.  Air pollutants such as dust, household cleaners, hair sprays, aerosol sprays, paint fumes, strong chemicals, or strong odors.  Cold air or weather  changes. Cold air may trigger inflammation. Winds increase molds and pollens in the air.  Strong emotions such as crying or laughing hard.  Stress.  Certain medicines such as aspirin  or beta-blockers.  Sulfites in foods and drinks, such as dried fruits and wine.  Infections or inflammatory conditions, such as a flu, cold, or inflammation of the nasal membranes (rhinitis).  Gastroesophageal reflux disease (GERD). GERD is a condition where stomach acid backs up into your esophagus.  Exercise or strenuous activity. SIGNS AND SYMPTOMS   Wheezing.  Excessive coughing, particularly at night.  Chest tightness.  Shortness of breath. DIAGNOSIS  Your health care provider will ask you about your medical history and perform a physical exam. A chest X-ray or blood testing may be performed to look for other causes of your symptoms or other conditions that may have triggered your asthma attack. TREATMENT  Treatment is aimed at reducing inflammation and opening up the airways in your lungs. Most asthma attacks are treated with inhaled medicines. These include quick relief or rescue medicines (such as bronchodilators) and controller medicines (such as inhaled corticosteroids). These medicines are sometimes given through an inhaler or a nebulizer. Systemic steroid medicine taken by mouth or given through an IV tube also can be used to reduce the inflammation when an attack is moderate or severe. Antibiotic medicines are only used if a bacterial infection is present.  HOME CARE INSTRUCTIONS   Rest.  Drink plenty of liquids. This helps the mucus to remain thin and be easily coughed up. Only use caffeine in moderation and do not use alcohol until you have recovered from your illness.  Do not smoke. Avoid being exposed to secondhand smoke.  You play a critical role in keeping yourself in good health. Avoid exposure to things that cause you to wheeze or to have breathing problems.  Keep your medicines up-to-date and available. Carefully follow your health care provider's treatment plan.  Take your medicine exactly as prescribed.  When pollen or pollution is bad, keep windows  closed and use an air conditioner or go to places with air conditioning.  Asthma requires careful medical care. See your health care provider for a follow-up as advised. If you are more than [redacted] weeks pregnant and you were prescribed any new medicines, let your obstetrician know about the visit and how you are doing. Follow up with your health care provider as directed.  After you have recovered from your asthma attack, make an appointment with your outpatient doctor to talk about ways to reduce the likelihood of future attacks. If you do not have a doctor who manages your asthma, make an appointment with a primary care doctor to discuss your asthma. SEEK IMMEDIATE MEDICAL CARE IF:   You are getting worse.  You have trouble breathing. If severe, call your local emergency services (911 in the [place]  You develop chest pain or discomfort.  You are vomiting.  You are not able to keep fluids down.  You are coughing up yellow, green, brown, or bloody sputum.  You have a fever and your symptoms suddenly get worse.  You have trouble swallowing. MAKE SURE YOU:   Understand these instructions.  Will watch your condition.  Will get help right away if you are not doing well or get worse. Document Released: 11/11/2006 Document Revised: 08/01/2013 Document Reviewed: 02/01/2013 [facility] Patient Information 2015 [place], [place]. This information is not intended to replace advice given to you by your health care provider. Make sure you discuss  any questions you have with your health care provider.  Pharyngitis Pharyngitis is a sore throat (pharynx). There is redness, pain, and swelling of your throat. HOME CARE   Drink enough fluids to keep your pee (urine) clear or pale yellow.  Only take medicine as told by your doctor.  You may get sick again if you do not take medicine as told. Finish your medicines, even if you start to feel better.  Do not take aspirin.  Rest.  Rinse your mouth  (gargle) with salt water ( tsp of salt per 1 qt of water) every 1-2 hours. This will help the pain.  If you are not at risk for choking, you can suck on hard candy or sore throat lozenges. GET HELP IF:  You have large, tender lumps on your neck.  You have a rash.  You cough up green, yellow-brown, or bloody spit. GET HELP RIGHT AWAY IF:   You have a stiff neck.  You drool or cannot swallow liquids.  You throw up (vomit) or are not able to keep medicine or liquids down.  You have very bad pain that does not go away with medicine.  You have problems breathing (not from a stuffy nose). MAKE SURE YOU:   Understand these instructions.  Will watch your condition.  Will get help right away if you are not doing well or get worse. Document Released: 01/13/2008 Document Revised: 05/17/2013 Document Reviewed: 04/03/2013 [facility] Patient Information 2015 [place], [place]. This information is not intended to replace advice given to you by your health care provider. Make sure you discuss any questions you have with your health care provider.  Sore Throat A sore throat is a painful, burning, sore, or scratchy feeling of the throat. There may be pain or tenderness when swallowing or talking. You may have other symptoms with a sore throat. These include coughing, sneezing, fever, or a swollen neck. A sore throat is often the first sign of another sickness. These sicknesses may include a cold, flu, strep throat, or an infection called mono. Most sore throats go away without medical treatment.  HOME CARE   Only take medicine as told by your doctor.  Drink enough fluids to keep your pee (urine) clear or pale yellow.  Rest as needed.  Try using throat sprays, lozenges, or suck on hard candy (if older than 4 years or as told).  Sip warm liquids, such as broth, herbal tea, or warm water with honey. Try sucking on frozen ice pops or drinking cold liquids.  Rinse the mouth (gargle) with salt  water. Mix 1 teaspoon salt with 8 ounces of water.  Do not smoke. Avoid being around others when they are smoking.  Put a humidifier in your bedroom at night to moisten the air. You can also turn on a hot shower and sit in the bathroom for 5-10 minutes. Be sure the bathroom door is closed. GET HELP RIGHT AWAY IF:   You have trouble breathing.  You cannot swallow fluids, soft foods, or your spit (saliva).  You have more puffiness (swelling) in the throat.  Your sore throat does not get better in 7 days.  You feel sick to your stomach (nauseous) and throw up (vomit).  You have a fever or lasting symptoms for more than 2-3 days.  You have a fever and your symptoms suddenly get worse. MAKE SURE YOU:   Understand these instructions.  Will watch your condition.  Will get help right away if you are not  doing well or get worse. Document Released: 05/05/2008 Document Revised: 04/20/2012 Document Reviewed: 04/03/2012 [facility] Patient Information 2015 [place], [place]. This information is not intended to replace advice given to you by your health care provider. Make sure you discuss any questions you have with your health care provider.

## 2014-12-24 NOTE — ED Notes (Signed)
C/o sore throat  For three days  States he has a fever, diarrhea, bloody mucous and coughing nyquil and dayquil was used as tx

## 2014-12-24 NOTE — ED Provider Notes (Signed)
CSN: [id number]     Arrival date & time 12/24/14  1744 History   First MD Initiated Contact with Patient 12/24/14 1843     Chief Complaint  Patient presents with  . Sore Throat   (Consider location/radiation/quality/duration/timing/severity/associated sxs/prior Treatment) HPI Comments: 24 year old male is complaining of a tight feeling in his throat. Decreased appetite, PND, cough, night sweats for 4 days. His temperature on arrival is 101.4. He has a history of asthma.  Patient is a 24 y.o. male presenting with pharyngitis.  Sore Throat Pertinent negatives include no shortness of breath.    Past Medical History  Diagnosis Date  . Asthma    History reviewed. No pertinent past surgical history. History reviewed. No pertinent family history. History  Substance Use Topics  . Smoking status: Unknown If Ever Smoked  . Smokeless tobacco: Not on file  . Alcohol Use: No    Review of Systems  Constitutional: Positive for fever and activity change. Negative for diaphoresis and fatigue.  HENT: Positive for congestion, postnasal drip, rhinorrhea, sore throat and trouble swallowing. Negative for ear pain and facial swelling.   Eyes: Negative for pain, discharge and redness.  Respiratory: Positive for cough and wheezing. Negative for chest tightness and shortness of breath.   Cardiovascular: Negative.   Gastrointestinal: Negative.   Musculoskeletal: Negative.  Negative for neck pain and neck stiffness.  Skin: Negative for rash.  Neurological: Negative.     Allergies  Review of patient's allergies indicates no known allergies.  Home Medications   Prior to Admission medications   Medication Sig Start Date End Date Taking? Authorizing Provider  albuterol (PROVENTIL HFA;VENTOLIN HFA) 108 (90 BASE) MCG/ACT inhaler Inhale 2 puffs into the lungs every 4 (four) hours as needed for wheezing or shortness of breath. 12/24/14   Hayden Rasmussenavid Nysir Fergusson, NP  amoxicillin (AMOXIL) 500 MG capsule Take 2 capsules  (1,000 mg total) by mouth 2 (two) times daily. 12/24/14   Hayden Rasmussenavid Zaylyn Bergdoll, NP  cetirizine (ZYRTEC) 10 MG tablet Take 1 tablet (10 mg total) by mouth daily. 12/06/14   Charm RingsErin J Honig, MD  ciprofloxacin-dexamethasone (CIPRODEX) otic suspension Place 4 drops into the left ear 2 (two) times daily. For 7 days 12/06/14   Charm RingsErin J Honig, MD  fluticasone [facility](FLONASE) 50 MCG/ACT nasal spray Place 2 sprays into both nostrils daily. 12/06/14   Charm RingsErin J Honig, MD  predniSONE (DELTASONE) 20 MG tablet Take 3 tabs po on first day, 2 tabs second day, 2 tabs third day, 1 tab fourth day, 1 tab 5th day. Take with food. 12/24/14   Hayden Rasmussenavid Rider Ermis, NP   BP 128/86 mmHg  Pulse 102  Temp(Src) 101.4 F (38.6 C) (Oral)  Resp 18  SpO2 96% Physical Exam  Constitutional: He is oriented to person, place, and time. He appears well-developed and well-nourished. No distress.  HENT:  Bilateral TMs are normal Oropharynx is beefy red with involvement of small tonsillar tissue. Few exudates.  Eyes: Conjunctivae and EOM are normal.  Neck: Normal range of motion. Neck supple.  Cardiovascular: Normal rate, regular rhythm and normal heart sounds.   Pulmonary/Chest: Effort normal. He has wheezes. He has no rales.  Musculoskeletal: He exhibits no edema.  Lymphadenopathy:    He has cervical adenopathy.  Neurological: He is alert and oriented to person, place, and time.  Skin: Skin is warm and dry.  Psychiatric: He has a normal mood and affect.  Nursing note and vitals reviewed.   ED Course  Procedures (including critical care time) Labs Review Labs  Reviewed  POCT RAPID STREP A ([facility] ONLY)   Results for orders placed or performed during the hospital encounter of 12/24/14  POCT rapid strep A [facility])  Result Value Ref Range   Streptococcus, Group A Screen (Direct) NEGATIVE NEGATIVE    Imaging Review No results found.   MDM   1. Allergic rhinitis due to pollen   2. Exudative pharyngitis   3. Asthma exacerbation attacks,  mild persistent    Use Flonase nasal spray Zyrtec 10 mg daily Drink plenty of fluids stay well-hydrated Sudafed PE 10 mg every 4 hours as needed for congestion Prednisone and albuterol HFA as prescribed Amoxicillin as directed     Hayden Rasmussenavid Quientin Jent, NP 12/24/14 1929

## 2014-12-27 LAB — CULTURE, GROUP A STREP: STREP A CULTURE: NEGATIVE

## 2015-06-01 ENCOUNTER — Encounter (HOSPITAL_COMMUNITY): Payer: Self-pay | Admitting: Oncology

## 2015-06-01 ENCOUNTER — Emergency Department (HOSPITAL_COMMUNITY): Payer: Federal, State, Local not specified - PPO

## 2015-06-01 ENCOUNTER — Emergency Department (HOSPITAL_COMMUNITY)
Admission: EM | Admit: 2015-06-01 | Discharge: 2015-06-02 | Disposition: A | Discharge status: Home / Self Care | Payer: Federal, State, Local not specified - PPO | Attending: Emergency Medicine | Admitting: Emergency Medicine

## 2015-06-01 DIAGNOSIS — B9789 Other viral agents as the cause of diseases classified elsewhere: Secondary | ICD-10-CM

## 2015-06-01 DIAGNOSIS — Z720 Tobacco use: Secondary | ICD-10-CM | POA: Insufficient documentation

## 2015-06-01 DIAGNOSIS — Z792 Long term (current) use of antibiotics: Secondary | ICD-10-CM | POA: Insufficient documentation

## 2015-06-01 DIAGNOSIS — Z7951 Long term (current) use of inhaled steroids: Secondary | ICD-10-CM | POA: Insufficient documentation

## 2015-06-01 DIAGNOSIS — Z79899 Other long term (current) drug therapy: Secondary | ICD-10-CM | POA: Insufficient documentation

## 2015-06-01 DIAGNOSIS — J069 Acute upper respiratory infection, unspecified: Secondary | ICD-10-CM

## 2015-06-01 DIAGNOSIS — B349 Viral infection, unspecified: Secondary | ICD-10-CM

## 2015-06-01 DIAGNOSIS — J45901 Unspecified asthma with (acute) exacerbation: Secondary | ICD-10-CM | POA: Insufficient documentation

## 2015-06-01 DIAGNOSIS — J9801 Acute bronchospasm: Secondary | ICD-10-CM

## 2015-06-01 MED ORDER — IPRATROPIUM BROMIDE 0.02 % IN SOLN
0.5000 mg | Freq: Once | RESPIRATORY_TRACT | Status: AC
  Administered 2015-06-01: 0.5 mg via RESPIRATORY_TRACT
  Filled 2015-06-01: qty 2.5

## 2015-06-01 MED ORDER — FLUTICASONE PROPIONATE 50 MCG/ACT NA SUSP
2.0000 | Freq: Every day | NASAL | Status: DC
  Administered 2015-06-02: 2 via NASAL
  Filled 2015-06-01: qty 16

## 2015-06-01 MED ORDER — PREDNISONE 20 MG PO TABS
60.0000 mg | ORAL_TABLET | Freq: Once | ORAL | Status: AC
  Administered 2015-06-01: 60 mg via ORAL
  Filled 2015-06-01: qty 3

## 2015-06-01 MED ORDER — ALBUTEROL SULFATE (2.5 MG/3ML) 0.083% IN NEBU
5.0000 mg | INHALATION_SOLUTION | Freq: Once | RESPIRATORY_TRACT | Status: AC
  Administered 2015-06-01: 5 mg via RESPIRATORY_TRACT
  Filled 2015-06-01: qty 6

## 2015-06-01 NOTE — ED Notes (Addendum)
Pt w/ hx of asthma began having SOB and cough yesterday.  Pt presents d/t progressive SOB.  Pt is able to speak in full sentences and RR is even and unlabored.  Denies pain.  Pt used 2 albuterol neb tx PTA.

## 2015-06-01 NOTE — ED Provider Notes (Signed)
CSN: [id number]     Arrival date & time 06/01/15  2217 History  By signing my name below, I, Peter Greer, attest that this documentation has been prepared under the direction and in the presence of [employer], PA-C. Electronically Signed: [name], ED Scribe. 06/01/2015. 10:59 PM.    Chief Complaint  Patient presents with  . Asthma   The history is provided by the patient. No language interpreter was used.    HPI Comments: Peter Greer is a 24 y.o. male with h/o asthma who presents to the Emergency Department complaining of an exacerbation of asthma onset yesterday and worsened today. He states associated sore throat (onset first), congestion, rhinorrhea, chest tightness, productive cough with occasional blood streaking. He notes his phlegm yesterday was green and states it is yellow today. He states he has taken albuterol with temporary relief. He has also taken OTC cold medication with mild relief. He also notes that he took 2 Prednisone yesterday leftover from a recent prescription. Pt is a current smoker. Pt states sick contact with son who also has a cough.   Past Medical History  Diagnosis Date  . Asthma    History reviewed. No pertinent past surgical history. No family history on file. Social History  Substance Use Topics  . Smoking status: Current Every Day Smoker -- 0.50 packs/day  . Smokeless tobacco: Never Used  . Alcohol Use: No    Review of Systems  HENT: Positive for congestion, rhinorrhea and sore throat.   Respiratory: Positive for cough and chest tightness.   All other systems reviewed and are negative.  Allergies  Review of patient's allergies indicates no known allergies.  Home Medications   Prior to Admission medications   Medication Sig Start Date End Date Taking? Authorizing Provider  albuterol (PROVENTIL HFA;VENTOLIN HFA) 108 (90 BASE) MCG/ACT inhaler Inhale 2 puffs into the lungs every 4 (four) hours as needed for wheezing or shortness of breath.  12/24/14   Hayden Rasmussen, NP  albuterol (PROVENTIL) (2.5 MG/3ML) 0.083% nebulizer solution Take 3 mLs (2.5 mg total) by nebulization every 4 (four) hours as needed for wheezing or shortness of breath. 06/02/15   Antony Madura, PA-C  amoxicillin (AMOXIL) 500 MG capsule Take 2 capsules (1,000 mg total) by mouth 2 (two) times daily. 12/24/14   Hayden Rasmussen, NP  benzonatate (TESSALON) 100 MG capsule Take 1 capsule (100 mg total) by mouth every 8 (eight) hours. 06/02/15   Antony Madura, PA-C  cetirizine (ZYRTEC) 10 MG tablet Take 1 tablet (10 mg total) by mouth daily. 12/06/14   Charm Rings, MD  ciprofloxacin-dexamethasone (CIPRODEX) otic suspension Place 4 drops into the left ear 2 (two) times daily. For 7 days 12/06/14   Charm Rings, MD  fluticasone [facility]) 50 MCG/ACT nasal spray Place 2 sprays into both nostrils daily. 12/06/14   Charm Rings, MD  predniSONE (DELTASONE) 20 MG tablet Take 2 tablets (40 mg total) by mouth daily. 06/02/15   Antony Madura, PA-C   BP 133/81 mmHg  Pulse 97  Temp(Src) 98.6 F (37 C) (Oral)  Resp 18  Ht  (1.753 m)  Wt 256 lb (116.121 kg)  BMI 37.79 kg/m2  SpO2 96%   Physical Exam  Constitutional: He is oriented to person, place, and time. He appears well-developed and well-nourished. No distress.  Nontoxic/nonseptic appearing  HENT:  Head: Normocephalic and atraumatic.  Mouth/Throat: Oropharynx is clear and moist.  Audible nasal congestion. Patient tolerating secretions without difficulty.  Eyes: Conjunctivae and  EOM are normal. No scleral icterus.  Neck: Normal range of motion.  No nuchal rigidity or meningismus  Cardiovascular: Normal rate, regular rhythm and intact distal pulses.   Pulmonary/Chest: Effort normal. No respiratory distress. He has wheezes. He has no rales.  Chest expansion symmetric. No accessory muscle use. Faint expiratory wheezing heard in the bilateral lower lung fields. No rales or rhonchi.  Musculoskeletal: Normal range of motion.   Neurological: He is alert and oriented to person, place, and time. He exhibits normal muscle tone. Coordination normal.  GCS 15. Patient moving all extremities. He is ambulatory with steady gait.  Skin: Skin is warm and dry. No rash noted. He is not diaphoretic. No erythema. No pallor.  Psychiatric: He has a normal mood and affect. His behavior is normal.  Nursing note and vitals reviewed.   ED Course  Procedures (including critical care time) DIAGNOSTIC STUDIES: Oxygen Saturation is 99% on RA, normal by my interpretation.    COORDINATION OF CARE: 10:57 PM Discussed treatment plan with pt at bedside and pt agreed to plan.   Imaging Review Dg Chest 2 View  06/02/2015  CLINICAL DATA:  Initial valuation for acute shortness of breath, cough. History of asthma. EXAM: CHEST  2 VIEW COMPARISON:  Prior radiograph from 11/01/2011. FINDINGS: The cardiac and mediastinal silhouettes are stable in size and contour, and remain within normal limits. The lungs are normally inflated. Mild diffuse peribronchial thickening. No airspace consolidation, pleural effusion, or pulmonary edema is identified. There is no pneumothorax. No acute osseous abnormality identified. IMPRESSION: Mild diffuse peribronchial thickening, which can be seen in the setting of asthma/ reactive airways disease. No other active cardiopulmonary disease. Electronically Signed   By: Rise Mu M.D.   On: 06/02/2015 00:16   I have personally reviewed and evaluated these images as part of my medical decision-making.   Medications  fluticasone (FLONASE) 50 MCG/ACT nasal spray 2 spray (2 sprays Each Nare Given 06/02/15 0133)  predniSONE (DELTASONE) tablet 60 mg (60 mg Oral Given 06/01/15 2313)  ipratropium (ATROVENT) nebulizer solution 0.5 mg (0.5 mg Nebulization Given 06/01/15 2313)  albuterol (PROVENTIL) (2.5 MG/3ML) 0.083% nebulizer solution 5 mg (5 mg Nebulization Given 06/01/15 2313)  albuterol (PROVENTIL) (2.5 MG/3ML)  0.083% nebulizer solution 5 mg (5 mg Nebulization Given 06/02/15 0034)  ipratropium (ATROVENT) nebulizer solution 0.5 mg (0.5 mg Nebulization Given 06/02/15 0034)    MDM   Final diagnoses:  Viral URI with cough  Acute bronchospasm due to viral infection    Pt CXR negative for acute infiltrate. Patients symptoms are consistent with URI, likely viral etiology. Discussed that antibiotics are not indicated for viral infections. Care in ED included DuoNeb x 2, oral steroids, and Flonase. Patient ambulatory in the ED without hypoxia. Plan to discharge with Tessalon, albuterol solution, and 5 day burst of steroids. Primary care follow up advised and return precautions discussed. Patient verbalizes understanding and is agreeable with plan. Patient is hemodynamically stable and in NAD prior to discharge. He reports feeling much improved compared to when he presented to the ED this evening.  I, Olla Delancey, personally performed the services described in this documentation. All medical record entries made by the scribe were at my direction and in my presence.  I have reviewed the chart and discharge instructions and agree that the record reflects my personal performance and is accurate and complete. Raymonda Pell.  06/02/2015. 1:39 AM.    Ceasar Mons Vitals:   06/01/15 2228 06/01/15 2229 06/02/15 0023 06/02/15 0132  BP:  132/69   133/81  Pulse: 98   97  Temp: 98.6 F (37 C)     TempSrc: Oral     Resp: 20   18  Height: 5\' 9"  (1.753 m)     Weight:  256 lb (116.121 kg)    SpO2: 99%  94% 96%     Antony MaduraKelly Aubri Gathright, PA-C 06/02/15 0142  Shon Batonourtney F Horton, MD 06/02/15 [id number]

## 2015-06-02 MED ORDER — ALBUTEROL SULFATE (2.5 MG/3ML) 0.083% IN NEBU
5.0000 mg | INHALATION_SOLUTION | Freq: Once | RESPIRATORY_TRACT | Status: AC
  Administered 2015-06-02: 5 mg via RESPIRATORY_TRACT
  Filled 2015-06-02: qty 6

## 2015-06-02 MED ORDER — PREDNISONE 20 MG PO TABS: 40.0000 mg | ORAL_TABLET | Freq: Every day | ORAL | Status: AC

## 2015-06-02 MED ORDER — IPRATROPIUM BROMIDE 0.02 % IN SOLN
0.5000 mg | Freq: Once | RESPIRATORY_TRACT | Status: AC
  Administered 2015-06-02: 0.5 mg via RESPIRATORY_TRACT
  Filled 2015-06-02: qty 2.5

## 2015-06-02 MED ORDER — BENZONATATE 100 MG PO CAPS: 100.0000 mg | ORAL_CAPSULE | Freq: Three times a day (TID) | ORAL | Status: AC

## 2015-06-02 MED ORDER — ALBUTEROL SULFATE (2.5 MG/3ML) 0.083% IN NEBU: 2.5000 mg | INHALATION_SOLUTION | RESPIRATORY_TRACT | Status: AC | PRN

## 2015-06-02 NOTE — ED Notes (Signed)
Flonase delayed in being sent from pharmacy.

## 2015-06-02 NOTE — ED Notes (Signed)
Patient maintained oxygen saturation of 95-96% while ambulating.

## 2015-06-02 NOTE — Discharge Instructions (Signed)
Upper Respiratory Infection, Adult  Most upper respiratory infections (URIs) are a viral infection of the air passages leading to the lungs. A URI affects the nose, throat, and upper air passages. The most common type of URI is nasopharyngitis and is typically referred to as "the common cold."  URIs run their course and usually go away on their own. Most of the time, a URI does not require medical attention, but sometimes a bacterial infection in the upper airways can follow a viral infection. This is called a secondary infection. Sinus and middle ear infections are common types of secondary upper respiratory infections.  Bacterial pneumonia can also complicate a URI. A URI can worsen asthma and chronic obstructive pulmonary disease (COPD). Sometimes, these complications can require emergency medical care and may be life threatening.   CAUSES  Almost all URIs are caused by viruses. A virus is a type of germ and can spread from one person to another.   RISKS FACTORS  You may be at risk for a URI if:    You smoke.    You have chronic heart or lung disease.   You have a weakened defense (immune) system.    You are very young or very old.    You have nasal allergies or asthma.   You work in crowded or poorly ventilated areas.   You work in health care facilities or schools.  SIGNS AND SYMPTOMS   Symptoms typically develop 2-3 days after you come in contact with a cold virus. Most viral URIs last 7-10 days. However, viral URIs from the influenza virus (flu virus) can last 14-18 days and are typically more severe. Symptoms may include:    Runny or stuffy (congested) nose.    Sneezing.    Cough.    Sore throat.    Headache.    Fatigue.    Fever.    Loss of appetite.    Pain in your forehead, behind your eyes, and over your cheekbones (sinus pain).   Muscle aches.   DIAGNOSIS   Your health care provider may diagnose a URI by:   Physical exam.   Tests to check that your symptoms are not due to  another condition such as:   Strep throat.   Sinusitis.   Pneumonia.   Asthma.  TREATMENT   A URI goes away on its own with time. It cannot be cured with medicines, but medicines may be prescribed or recommended to relieve symptoms. Medicines may help:   Reduce your fever.   Reduce your cough.   Relieve nasal congestion.  HOME CARE INSTRUCTIONS    Take medicines only as directed by your health care provider.    Gargle warm saltwater or take cough drops to comfort your throat as directed by your health care provider.   Use a warm mist humidifier or inhale steam from a shower to increase air moisture. This may make it easier to breathe.   Drink enough fluid to keep your urine clear or pale yellow.    Eat soups and other clear broths and maintain good nutrition.    Rest as needed.    Return to work when your temperature has returned to normal or as your health care provider advises. You may need to stay home longer to avoid infecting others. You can also use a face mask and careful hand washing to prevent spread of the virus.   Increase the usage of your inhaler if you have asthma.    Do not   use any tobacco products, including cigarettes, chewing tobacco, or electronic cigarettes. If you need help quitting, ask your health care provider.  PREVENTION   The best way to protect yourself from getting a cold is to practice good hygiene.    Avoid oral or hand contact with people with cold symptoms.    Wash your hands often if contact occurs.   There is no clear evidence that vitamin C, vitamin E, echinacea, or exercise reduces the chance of developing a cold. However, it is always recommended to get plenty of rest, exercise, and practice good nutrition.   SEEK MEDICAL CARE IF:    You are getting worse rather than better.    Your symptoms are not controlled by medicine.    You have chills.   You have worsening shortness of breath.   You have brown or red mucus.   You have yellow or brown nasal  discharge.   You have pain in your face, especially when you bend forward.   You have a fever.   You have swollen neck glands.   You have pain while swallowing.   You have white areas in the back of your throat.  SEEK IMMEDIATE MEDICAL CARE IF:    You have severe or persistent:    Headache.    Ear pain.    Sinus pain.    Chest pain.   You have chronic lung disease and any of the following:    Wheezing.    Prolonged cough.    Coughing up blood.    A change in your usual mucus.   You have a stiff neck.   You have changes in your:    Vision.    Hearing.    Thinking.    Mood.  MAKE SURE YOU:    Understand these instructions.   Will watch your condition.   Will get help right away if you are not doing well or get worse.     This information is not intended to replace advice given to you by your health care provider. Make sure you discuss any questions you have with your health care provider.     Document Released: 01/20/2001 Document Revised: 12/11/2014 Document Reviewed: 11/01/2013  Elsevier Interactive Patient Education 2016 [employer]    Bronchospasm, Adult  A bronchospasm is a spasm or tightening of the airways going into the lungs. During a bronchospasm breathing becomes more difficult because the airways get smaller. When this happens there can be coughing, a whistling sound when breathing (wheezing), and difficulty breathing. Bronchospasm is often associated with asthma, but not all patients who experience a bronchospasm have asthma.  CAUSES   A bronchospasm is caused by inflammation or irritation of the airways. The inflammation or irritation may be triggered by:    Allergies (such as to animals, pollen, food, or mold). Allergens that cause bronchospasm may cause wheezing immediately after exposure or many hours later.    Infection. Viral infections are believed to be the most common cause of bronchospasm.    Exercise.    Irritants (such as pollution, cigarette smoke, strong odors, aerosol  sprays, and paint fumes).    Weather changes. Winds increase molds and pollens in the air. Rain refreshes the air by washing irritants out. Cold air may cause inflammation.    Stress and emotional upset.   SIGNS AND SYMPTOMS    Wheezing.    Excessive nighttime coughing.    Frequent or severe coughing with a simple cold.      Chest tightness.    Shortness of breath.   DIAGNOSIS   Bronchospasm is usually diagnosed through a history and physical exam. Tests, such as chest X-rays, are sometimes done to look for other conditions.  TREATMENT    Inhaled medicines can be given to open up your airways and help you breathe. The medicines can be given using either an inhaler or a nebulizer machine.   Corticosteroid medicines may be given for severe bronchospasm, usually when it is associated with asthma.  HOME CARE INSTRUCTIONS    Always have a plan prepared for seeking medical care. Know when to call your health care provider and local emergency services (911 in the [place] Know where you can access local emergency care.   Only take medicines as directed by your health care provider.   If you were prescribed an inhaler or nebulizer machine, ask your health care provider to explain how to use it correctly. Always use a spacer with your inhaler if you were given one.   It is necessary to remain calm during an attack. Try to relax and breathe more slowly.   Control your home environment in the following ways:     Change your heating and air conditioning filter at least once a month.     Limit your use of fireplaces and wood stoves.    Do not smoke and do not allow smoking in your home.     Avoid exposure to perfumes and fragrances.     Get rid of pests (such as roaches and mice) and their droppings.     Throw away plants if you see mold on them.     Keep your house clean and dust free.     Replace carpet with wood, tile, or vinyl flooring. Carpet can trap dander and dust.     Use allergy-proof  pillows, mattress covers, and box spring covers.     Wash bed sheets and blankets every week in hot water and dry them in a dryer.     Use blankets that are made of polyester or cotton.     Wash hands frequently.  SEEK MEDICAL CARE IF:    You have muscle aches.    You have chest pain.    The sputum changes from clear or white to yellow, green, gray, or bloody.    The sputum you cough up gets thicker.    There are problems that may be related to the medicine you are given, such as a rash, itching, swelling, or trouble breathing.   SEEK IMMEDIATE MEDICAL CARE IF:    You have worsening wheezing and coughing even after taking your prescribed medicines.    You have increased difficulty breathing.    You develop severe chest pain.  MAKE SURE YOU:    Understand these instructions.   Will watch your condition.   Will get help right away if you are not doing well or get worse.     This information is not intended to replace advice given to you by your health care provider. Make sure you discuss any questions you have with your health care provider.     Document Released: 07/30/2003 Document Revised: 08/17/2014 Document Reviewed: 01/16/2013  Elsevier Interactive Patient Education 2016 Elsevier Inc.

## 2016-07-07 ENCOUNTER — Encounter (HOSPITAL_COMMUNITY): Payer: Self-pay | Admitting: Emergency Medicine

## 2016-07-07 ENCOUNTER — Emergency Department (HOSPITAL_COMMUNITY)
Admission: EM | Admit: 2016-07-07 | Discharge: 2016-07-07 | Disposition: A | Discharge status: Home / Self Care | Payer: Federal, State, Local not specified - PPO | Attending: Emergency Medicine | Admitting: Emergency Medicine

## 2016-07-07 DIAGNOSIS — F17200 Nicotine dependence, unspecified, uncomplicated: Secondary | ICD-10-CM | POA: Insufficient documentation

## 2016-07-07 DIAGNOSIS — K0889 Other specified disorders of teeth and supporting structures: Secondary | ICD-10-CM

## 2016-07-07 DIAGNOSIS — J45909 Unspecified asthma, uncomplicated: Secondary | ICD-10-CM | POA: Insufficient documentation

## 2016-07-07 DIAGNOSIS — S025XXA Fracture of tooth (traumatic), initial encounter for closed fracture: Secondary | ICD-10-CM

## 2016-07-07 DIAGNOSIS — K0381 Cracked tooth: Secondary | ICD-10-CM | POA: Insufficient documentation

## 2016-07-07 MED ORDER — DICLOFENAC SODIUM 50 MG PO TBEC: 50.0000 mg | DELAYED_RELEASE_TABLET | Freq: Two times a day (BID) | ORAL | 0 refills | Status: AC

## 2016-07-07 MED ORDER — AMOXICILLIN 500 MG PO CAPS: 500.0000 mg | ORAL_CAPSULE | Freq: Three times a day (TID) | ORAL | 0 refills | Status: DC

## 2016-07-07 NOTE — ED Provider Notes (Signed)
[facility]-EMERGENCY DEPT Provider Note   CSN: [id number] Arrival date & time: 07/07/16  0915  By signing my name below, I, Peter Greer, attest that this documentation has been prepared under the direction and in the presence of Cheron SchaumannLeslie Gretel Cantu, [place]PA-C. Electronically Signed: Placido SouLogan Greer, ED Scribe. 07/07/16. 10:16 AM.   History   Chief Complaint Chief Complaint  Patient presents with  . Dental Pain   HPI HPI Comments: Peter Greer is a 25 y.o. male who presents to the Emergency Department complaining of constant, moderate, right lower dental pain x 5 months which worsened beginning last night. He states he has a h/o dental issues in the region. He reports associated, mild, swelling in the affected region. His pain worsens with palpation and when chewing. He does not have a regular dental provider. He denies any other associated symptoms at this time.    The history is provided by the patient. No language interpreter was used.    Past Medical History:  Diagnosis Date  . Asthma     There are no active problems to display for this patient.   No past surgical history on file.     Home Medications    Prior to Admission medications   Medication Sig Start Date End Date Taking? Authorizing Provider  albuterol (PROVENTIL HFA;VENTOLIN HFA) 108 (90 BASE) MCG/ACT inhaler Inhale 2 puffs into the lungs every 4 (four) hours as needed for wheezing or shortness of breath. 12/24/14   Hayden Rasmussenavid Mabe, NP  albuterol (PROVENTIL) (2.5 MG/3ML) 0.083% nebulizer solution Take 3 mLs (2.5 mg total) by nebulization every 4 (four) hours as needed for wheezing or shortness of breath. 06/02/15   Antony MaduraKelly Humes, PA-C  amoxicillin (AMOXIL) 500 MG capsule Take 2 capsules (1,000 mg total) by mouth 2 (two) times daily. 12/24/14   Hayden Rasmussenavid Mabe, NP  benzonatate (TESSALON) 100 MG capsule Take 1 capsule (100 mg total) by mouth every 8 (eight) hours. 06/02/15   Antony MaduraKelly Humes, PA-C  cetirizine (ZYRTEC) 10 MG tablet Take 1 tablet  (10 mg total) by mouth daily. 12/06/14   Charm RingsErin J Honig, MD  ciprofloxacin-dexamethasone (CIPRODEX) otic suspension Place 4 drops into the left ear 2 (two) times daily. For 7 days 12/06/14   Charm RingsErin J Honig, MD  fluticasone [facility](FLONASE) 50 MCG/ACT nasal spray Place 2 sprays into both nostrils daily. 12/06/14   Charm RingsErin J Honig, MD  predniSONE (DELTASONE) 20 MG tablet Take 2 tablets (40 mg total) by mouth daily. 06/02/15   Antony MaduraKelly Humes, PA-C    Family History No family history on file.  Social History Social History  Substance Use Topics  . Smoking status: Current Every Day Smoker    Packs/day: 0.50  . Smokeless tobacco: Never Used  . Alcohol use No     Allergies   Patient has no known allergies.   Review of Systems Review of Systems  Constitutional: Negative for chills and fever.  HENT: Positive for dental problem and facial swelling. Negative for trouble swallowing.   All other systems reviewed and are negative.  Physical Exam Updated Vital Signs BP 136/85 (BP Location: Right Arm)   Pulse 90   Temp 98.4 F (36.9 C) (Oral)   Resp 18   Ht 5\' 8"  (1.727 m) Comment: Simultaneous filing. User may not have seen previous data.  Wt 250 lb (113.4 kg) Comment: Simultaneous filing. User may not have seen previous data.  SpO2 98%   BMI 38.01 kg/m   Physical Exam  Constitutional: He is oriented to person, place,  and time. He appears well-developed and well-nourished.  HENT:  Head: Normocephalic and atraumatic.  Broken right lower premolar  Eyes: EOM are normal.  Neck: Normal range of motion.  Cardiovascular: Normal rate.   Pulmonary/Chest: Effort normal. No respiratory distress.  Abdominal: Soft.  Musculoskeletal: Normal range of motion.  Neurological: He is alert and oriented to person, place, and time.  Skin: Skin is warm and dry.  Psychiatric: He has a normal mood and affect.  Nursing note and vitals reviewed.  ED Treatments / Results  Labs (all labs ordered are listed, but only  abnormal results are displayed) Labs Reviewed - No data to display  EKG  EKG Interpretation None       Radiology No results found.  Procedures Procedures  COORDINATION OF CARE: 10:15 AM Discussed next steps with pt. Pt verbalized understanding and is agreeable with the plan.    Medications Ordered in ED Medications - No data to display   Initial Impression / Assessment and Plan / ED Course  I have reviewed the triage vital signs and the nursing notes.  Pertinent labs & imaging results that were available during my care of the patient were reviewed by me and considered in my medical decision making (see chart for details).  Clinical Course     Patient with dentalgia.  No abscess requiring immediate incision and drainage.  Exam not concerning for Ludwig's angina or pharyngeal abscess.  Will treat with amoxacillin and Voltaren. Pt instructed to follow-up with dentist.  Discussed return precautions. Pt safe for discharge.   Final Clinical Impressions(s) / ED Diagnoses   Final diagnoses:  Closed fracture of tooth, initial encounter  Tooth pain    New Prescriptions Discharge Medication List as of 07/07/2016 10:21 AM     I personally performed the services in this documentation, which was scribed in my presence.  The recorded information has been reviewed and considered.   Barnet PallKaren SofiaPAC.   Lonia SkinnerLeslie K LinwoodSofia, PA-C 07/07/16 1155    Cathren LaineKevin Steinl, MD 07/07/16 [phone]

## 2016-07-07 NOTE — ED Triage Notes (Signed)
C/o right lower dental pain x 2 months. "I just couldn't take the pain anymore".

## 2016-08-16 IMAGING — CR DG CHEST 2V
2 series · 2 of 2 positions shown · non-contrast
Comparison: Prior radiograph from 11/01/2011.

CLINICAL DATA: Initial valuation for acute shortness of breath,
cough. History of asthma.

EXAM:
CHEST  2 VIEW

[w chest pa]
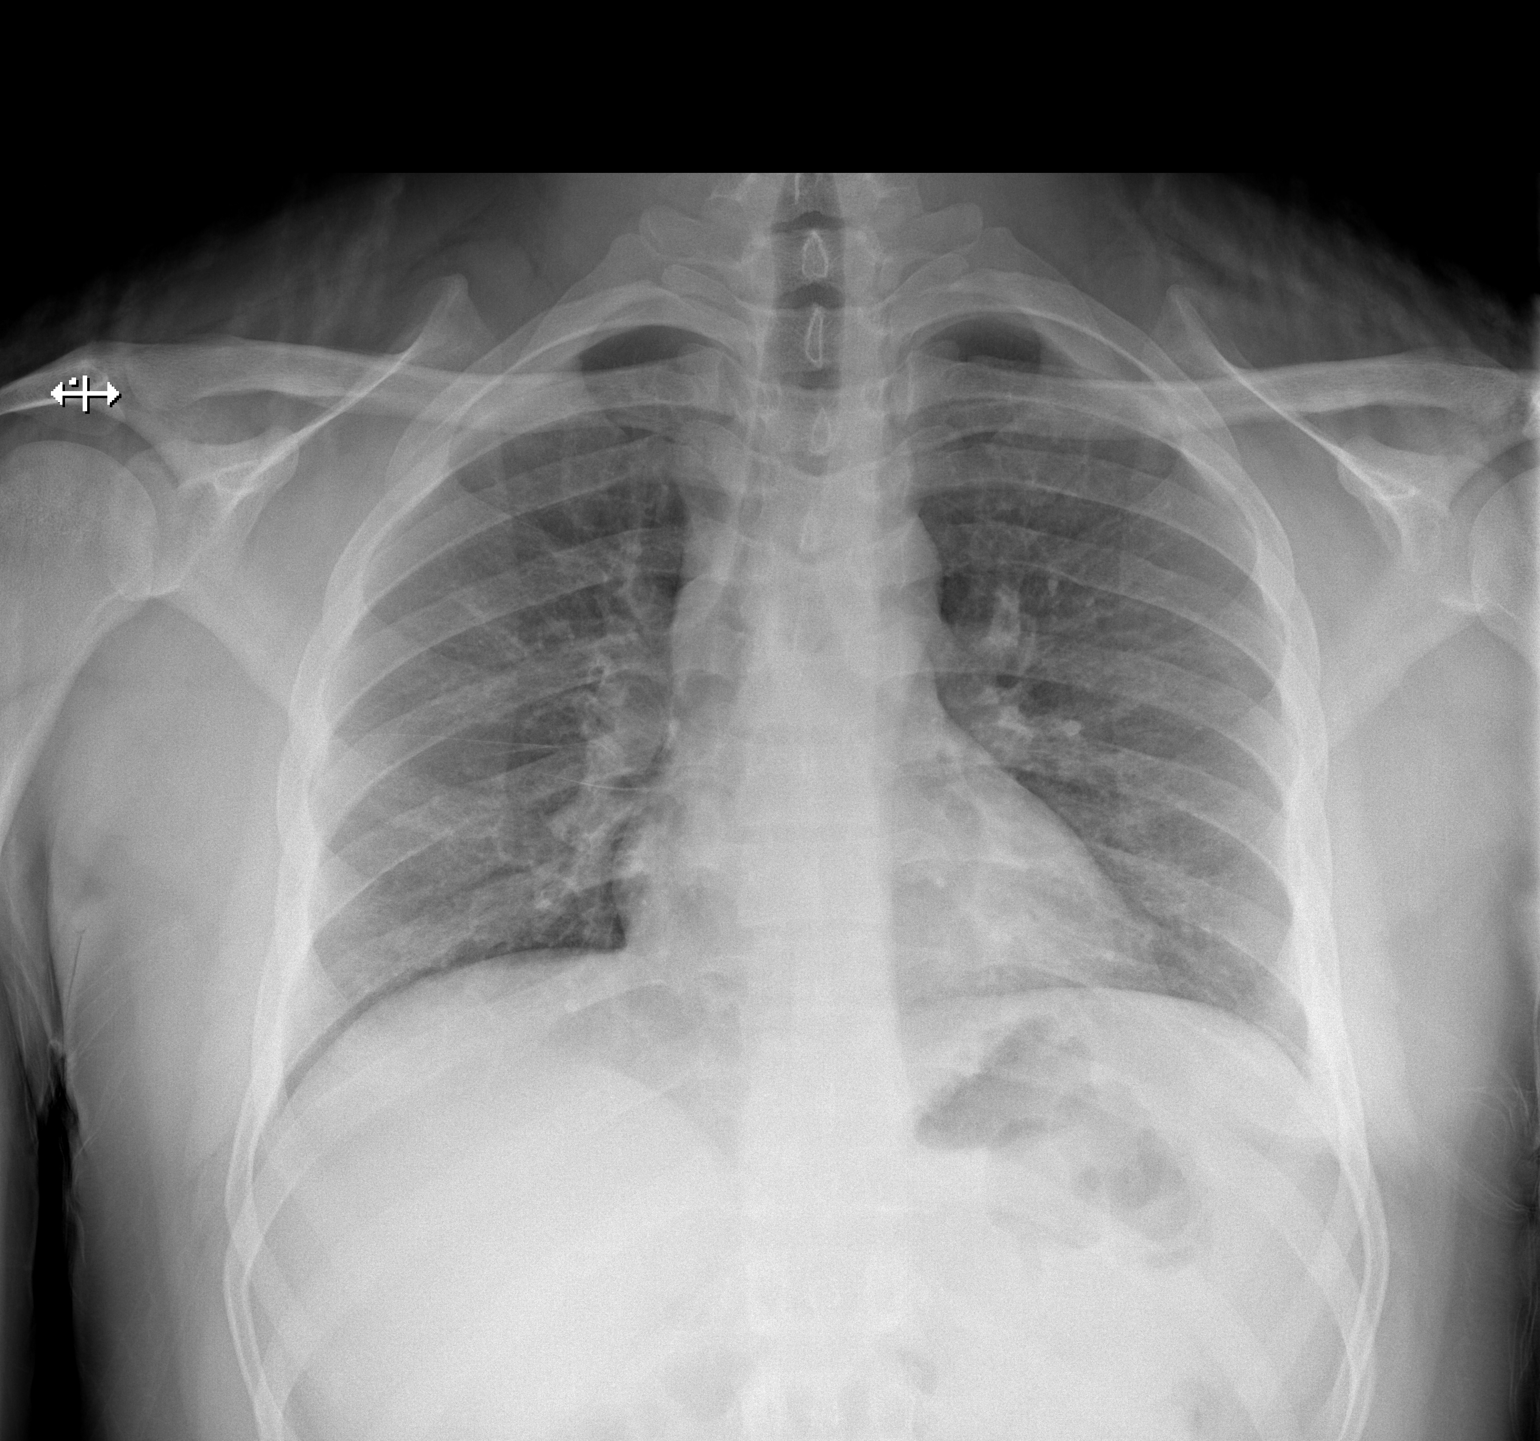

[w chest lat]
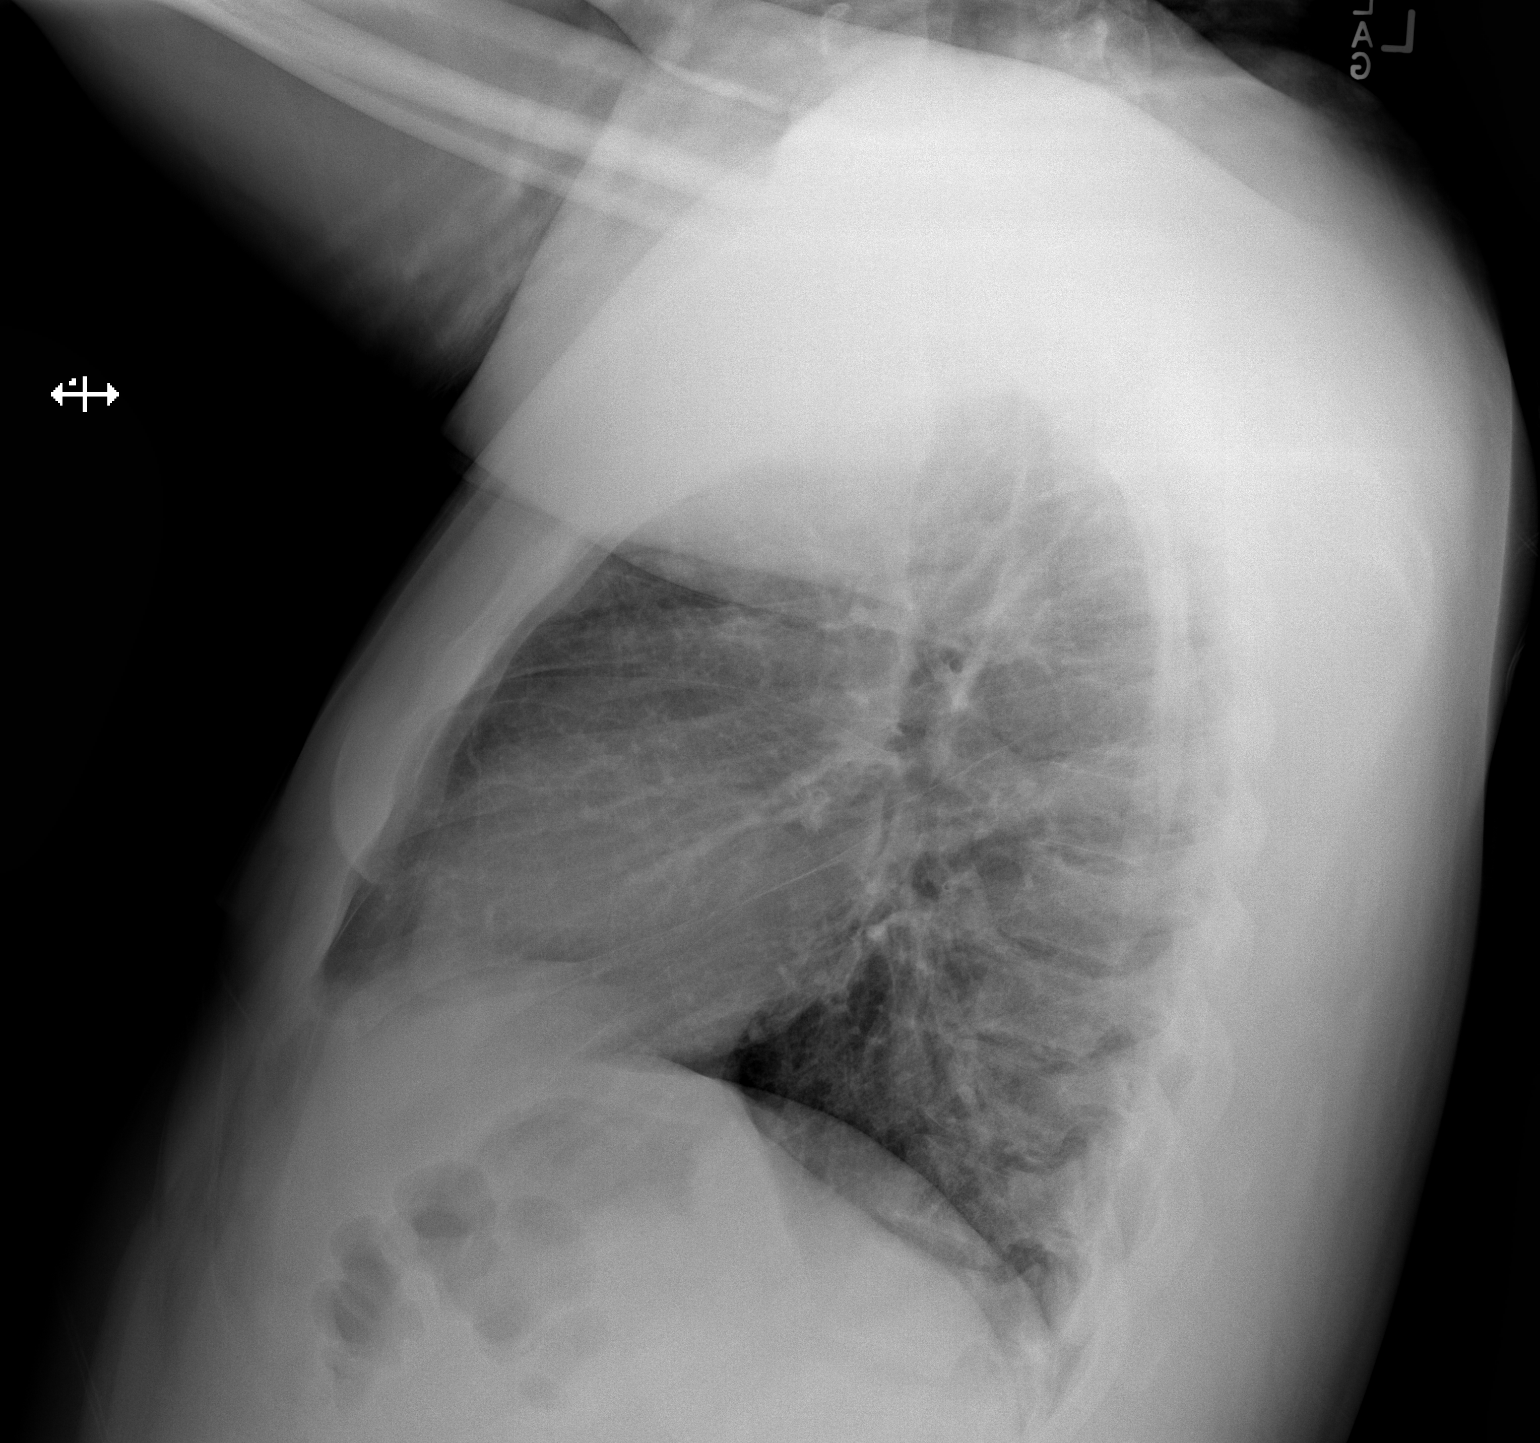

[2 of 2 positions shown; findings below may reference images not displayed]

FINDINGS: The cardiac and mediastinal silhouettes are stable in size and
contour, and remain within normal limits.

The lungs are normally inflated. Mild diffuse peribronchial
thickening. No airspace consolidation, pleural effusion, or
pulmonary edema is identified. There is no pneumothorax.

No acute osseous abnormality identified.
IMPRESSION: Mild diffuse peribronchial thickening, which can be seen in the
setting of asthma/ reactive airways disease. No other active
cardiopulmonary disease.

## 2018-08-21 ENCOUNTER — Other Ambulatory Visit: Payer: Self-pay

## 2018-08-21 ENCOUNTER — Encounter (HOSPITAL_COMMUNITY): Payer: Self-pay | Admitting: Emergency Medicine

## 2018-08-21 ENCOUNTER — Emergency Department (HOSPITAL_COMMUNITY)
Admission: EM | Admit: 2018-08-21 | Discharge: 2018-08-21 | Disposition: A | Discharge status: Home / Self Care | Payer: Self-pay | Attending: Emergency Medicine | Admitting: Emergency Medicine

## 2018-08-21 DIAGNOSIS — Y929 Unspecified place or not applicable: Secondary | ICD-10-CM | POA: Insufficient documentation

## 2018-08-21 DIAGNOSIS — F17200 Nicotine dependence, unspecified, uncomplicated: Secondary | ICD-10-CM | POA: Insufficient documentation

## 2018-08-21 DIAGNOSIS — Y999 Unspecified external cause status: Secondary | ICD-10-CM | POA: Insufficient documentation

## 2018-08-21 DIAGNOSIS — K029 Dental caries, unspecified: Secondary | ICD-10-CM | POA: Insufficient documentation

## 2018-08-21 DIAGNOSIS — Z79899 Other long term (current) drug therapy: Secondary | ICD-10-CM | POA: Insufficient documentation

## 2018-08-21 DIAGNOSIS — Y9389 Activity, other specified: Secondary | ICD-10-CM | POA: Insufficient documentation

## 2018-08-21 DIAGNOSIS — J45909 Unspecified asthma, uncomplicated: Secondary | ICD-10-CM | POA: Insufficient documentation

## 2018-08-21 DIAGNOSIS — X58XXXA Exposure to other specified factors, initial encounter: Secondary | ICD-10-CM | POA: Insufficient documentation

## 2018-08-21 DIAGNOSIS — S025XXA Fracture of tooth (traumatic), initial encounter for closed fracture: Secondary | ICD-10-CM | POA: Insufficient documentation

## 2018-08-21 MED ORDER — MELOXICAM 7.5 MG PO TABS: 7.5000 mg | ORAL_TABLET | Freq: Every day | ORAL | 0 refills | Status: AC

## 2018-08-21 MED ORDER — PENICILLIN V POTASSIUM 500 MG PO TABS: 500.0000 mg | ORAL_TABLET | Freq: Four times a day (QID) | ORAL | 0 refills | Status: AC

## 2018-08-21 NOTE — ED Triage Notes (Signed)
Pt reports l side dental pain x 48month. States he was eating a peanut last night when he noticed half of tooth came out with it the peanut

## 2018-08-21 NOTE — Discharge Instructions (Addendum)
Take penicillin and complete the full course.  Take meloxicam as needed for pain. Apply dental wax to area. Follow-up with dentist, resources given for referral.

## 2018-08-21 NOTE — ED Provider Notes (Signed)
[facility] EMERGENCY DEPARTMENT Provider Note   CSN: [id number] Arrival date & time: 08/21/18  1219     History   Chief Complaint Chief Complaint  Patient presents with  . Dental Pain    HPI Peter Greer is a 28 y.o. male.  28 yo male presents with left upper dental pain. Patient states left upper tooth going bad, bit into a peanut last night and the tooth broke. Denies other trauma, fevers, drainage. No other complaints or concerns.      Past Medical History:  Diagnosis Date  . Asthma     There are no active problems to display for this patient.   History reviewed. No pertinent surgical history.      Home Medications    Prior to Admission medications   Medication Sig Start Date End Date Taking? Authorizing Provider  albuterol (PROVENTIL HFA;VENTOLIN HFA) 108 (90 BASE) MCG/ACT inhaler Inhale 2 puffs into the lungs every 4 (four) hours as needed for wheezing or shortness of breath. 12/24/14   Hayden Rasmussen, NP  albuterol (PROVENTIL) (2.5 MG/3ML) 0.083% nebulizer solution Take 3 mLs (2.5 mg total) by nebulization every 4 (four) hours as needed for wheezing or shortness of breath. 06/02/15   Antony Madura, PA-C  amoxicillin (AMOXIL) 500 MG capsule Take 1 capsule (500 mg total) by mouth 3 (three) times daily. 07/07/16   Elson Areas, PA-C  benzonatate (TESSALON) 100 MG capsule Take 1 capsule (100 mg total) by mouth every 8 (eight) hours. 06/02/15   Antony Madura, PA-C  cetirizine (ZYRTEC) 10 MG tablet Take 1 tablet (10 mg total) by mouth daily. 12/06/14   Charm Rings, MD  ciprofloxacin-dexamethasone (CIPRODEX) otic suspension Place 4 drops into the left ear 2 (two) times daily. For 7 days 12/06/14   Charm Rings, MD  diclofenac (VOLTAREN) 50 MG EC tablet Take 1 tablet (50 mg total) by mouth 2 (two) times daily. 07/07/16   Elson Areas, PA-C  fluticasone (FLONASE) 50 MCG/ACT nasal spray Place 2 sprays into both nostrils daily. 12/06/14   Charm Rings,  MD  meloxicam (MOBIC) 7.5 MG tablet Take 1 tablet (7.5 mg total) by mouth daily for 10 days. 08/21/18 08/31/18  Army Melia A, PA-C  penicillin v potassium (VEETID) 500 MG tablet Take 1 tablet (500 mg total) by mouth 4 (four) times daily for 7 days. 08/21/18 08/28/18  Jeannie Fend, PA-C  predniSONE (DELTASONE) 20 MG tablet Take 2 tablets (40 mg total) by mouth daily. 06/02/15   Antony Madura, PA-C    Family History No family history on file.  Social History Social History   Tobacco Use  . Smoking status: Current Every Day Smoker    Packs/day: 0.50  . Smokeless tobacco: Never Used  Substance Use Topics  . Alcohol use: No  . Drug use: No     Allergies   Patient has no known allergies.   Review of Systems Review of Systems  Constitutional: Negative for fever.  HENT: Positive for dental problem. Negative for ear pain, facial swelling, trouble swallowing and voice change.   Gastrointestinal: Negative for nausea and vomiting.  Musculoskeletal: Negative for neck pain.  Skin: Negative for rash and wound.  Allergic/Immunologic: Negative for immunocompromised state.  Neurological: Negative for headaches.  Hematological: Negative for adenopathy.  Psychiatric/Behavioral: Negative for confusion.  All other systems reviewed and are negative.    Physical Exam Updated Vital Signs BP (!) 143/99   Pulse 88   Temp 99.8 F (  37.7 C) (Oral)   Resp 16   Ht 5\' 8"  (1.727 m)   Wt 127 kg   SpO2 99%   BMI 42.57 kg/m   Physical Exam Vitals signs and nursing note reviewed.  Constitutional:      General: He is not in acute distress.    Appearance: He is well-developed. He is not diaphoretic.  HENT:     Head: Normocephalic and atraumatic.     Nose: Nose normal.     Mouth/Throat:     Mouth: Mucous membranes are moist.   Neck:     Musculoskeletal: Neck supple.  Pulmonary:     Effort: Pulmonary effort is normal.  Lymphadenopathy:     Cervical: No cervical adenopathy.  Skin:     General: Skin is warm and dry.     Findings: No erythema or rash.  Neurological:     Mental Status: He is alert and oriented to person, place, and time.  Psychiatric:        Behavior: Behavior normal.      ED Treatments / Results  Labs (all labs ordered are listed, but only abnormal results are displayed) Labs Reviewed - No data to display  EKG None  Radiology No results found.  Procedures Procedures (including critical care time)  Medications Ordered in ED Medications - No data to display   Initial Impression / Assessment and Plan / ED Course  I have reviewed the triage vital signs and the nursing notes.  Pertinent labs & imaging results that were available during my care of the patient were reviewed by me and considered in my medical decision making (see chart for details).    Final Clinical Impressions(s) / ED Diagnoses   Final diagnoses:  Closed fracture of tooth, initial encounter    ED Discharge Orders         Ordered    penicillin v potassium (VEETID) 500 MG tablet  4 times daily     08/21/18 1255    meloxicam (MOBIC) 7.5 MG tablet  Daily     08/21/18 1255           Alden Hipp 08/21/18 1319    Sabas Sous, MD 08/22/18 1038

## 2018-11-11 ENCOUNTER — Other Ambulatory Visit: Payer: Self-pay

## 2018-11-11 ENCOUNTER — Emergency Department (HOSPITAL_COMMUNITY)
Admission: EM | Admit: 2018-11-11 | Discharge: 2018-11-11 | Disposition: A | Discharge status: Home / Self Care | Payer: Self-pay | Attending: Emergency Medicine | Admitting: Emergency Medicine

## 2018-11-11 DIAGNOSIS — H6690 Otitis media, unspecified, unspecified ear: Secondary | ICD-10-CM

## 2018-11-11 DIAGNOSIS — J45909 Unspecified asthma, uncomplicated: Secondary | ICD-10-CM | POA: Insufficient documentation

## 2018-11-11 DIAGNOSIS — F17210 Nicotine dependence, cigarettes, uncomplicated: Secondary | ICD-10-CM | POA: Insufficient documentation

## 2018-11-11 DIAGNOSIS — H6692 Otitis media, unspecified, left ear: Secondary | ICD-10-CM | POA: Insufficient documentation

## 2018-11-11 DIAGNOSIS — Z79899 Other long term (current) drug therapy: Secondary | ICD-10-CM | POA: Insufficient documentation

## 2018-11-11 MED ORDER — AMOXICILLIN-POT CLAVULANATE 875-125 MG PO TABS: 1.0000 | ORAL_TABLET | Freq: Two times a day (BID) | ORAL | 0 refills | Status: AC

## 2018-11-11 MED ORDER — AMOXICILLIN 500 MG PO CAPS: 500.0000 mg | ORAL_CAPSULE | Freq: Three times a day (TID) | ORAL | 0 refills | Status: DC

## 2018-11-11 NOTE — ED Provider Notes (Signed)
[facility] EMERGENCY DEPARTMENT Provider Note   CSN: [id number] Arrival date & time: 11/11/18  1054  History   Chief Complaint Chief Complaint  Patient presents with  . Otalgia   HPI Peter Greer is a 28 y.o. male with past medical history significant for asthma who presents for evaluation of left ear pain.  Patient states he has had left ear pain x1 week. He rates his pain a 7/10.  Pain does not radiate.  Scribes the pain as an aching sensation.  Patient states it is worse if he sleeps on his left ear.  Patient states he did have amoxicillin left over from a dental extraction, #4 pills that he took over the course of 3 days.  He said that his pain was getting better, however when he ran out of his prescription his pain returned.  Denies fever, chills, nausea, vomiting, neck pain, neck stiffness, headache, facial pain, facial swelling, ear discharge, tinnitus, decreased hearing, jaw pain, trismus, chest pain, shortness of breath.  Has not taken any OTC pain relievers for his pain.  History obtained from patient.  No interpreter is used.   HPI  Past Medical History:  Diagnosis Date  . Asthma     There are no active problems to display for this patient.   No past surgical history on file.      Home Medications    Prior to Admission medications   Medication Sig Start Date End Date Taking? Authorizing Provider  albuterol (PROVENTIL HFA;VENTOLIN HFA) 108 (90 BASE) MCG/ACT inhaler Inhale 2 puffs into the lungs every 4 (four) hours as needed for wheezing or shortness of breath. 12/24/14   Hayden Rasmussen, NP  albuterol (PROVENTIL) (2.5 MG/3ML) 0.083% nebulizer solution Take 3 mLs (2.5 mg total) by nebulization every 4 (four) hours as needed for wheezing or shortness of breath. 06/02/15   Antony Madura, PA-C  amoxicillin-clavulanate (AUGMENTIN) 875-125 MG tablet Take 1 tablet by mouth every 12 (twelve) hours. 11/11/18   Henderly, Britni A, PA-C  benzonatate (TESSALON) 100  MG capsule Take 1 capsule (100 mg total) by mouth every 8 (eight) hours. 06/02/15   Antony Madura, PA-C  cetirizine (ZYRTEC) 10 MG tablet Take 1 tablet (10 mg total) by mouth daily. 12/06/14   Charm Rings, MD  ciprofloxacin-dexamethasone (CIPRODEX) otic suspension Place 4 drops into the left ear 2 (two) times daily. For 7 days 12/06/14   Charm Rings, MD  diclofenac (VOLTAREN) 50 MG EC tablet Take 1 tablet (50 mg total) by mouth 2 (two) times daily. 07/07/16   Elson Areas, PA-C  fluticasone (FLONASE) 50 MCG/ACT nasal spray Place 2 sprays into both nostrils daily. 12/06/14   Charm Rings, MD  predniSONE (DELTASONE) 20 MG tablet Take 2 tablets (40 mg total) by mouth daily. 06/02/15   Antony Madura, PA-C    Family History No family history on file.  Social History Social History   Tobacco Use  . Smoking status: Current Every Day Smoker    Packs/day: 0.50  . Smokeless tobacco: Never Used  Substance Use Topics  . Alcohol use: No  . Drug use: No     Allergies   Patient has no known allergies.   Review of Systems Review of Systems  Constitutional: Negative.   HENT: Positive for ear pain. Negative for congestion, dental problem, drooling, ear discharge, facial swelling, hearing loss, mouth sores, sinus pressure, sinus pain, sneezing, sore throat, tinnitus and voice change.   Eyes: Negative.   Respiratory:  Negative.   Cardiovascular: Negative.   Gastrointestinal: Negative.   Genitourinary: Negative.   Musculoskeletal: Negative.  Negative for neck pain and neck stiffness.  Skin: Negative.   Neurological: Negative.  Negative for dizziness, facial asymmetry, numbness and headaches.  All other systems reviewed and are negative.    Physical Exam Updated Vital Signs BP (!) 147/97 (BP Location: Right Arm)   Pulse 92   Temp 98.8 F (37.1 C) (Oral)   Resp 20   Ht 5\' 9"  (1.753 m)   Wt 117.9 kg   SpO2 97%   BMI 38.40 kg/m   Physical Exam Vitals signs and nursing note  reviewed.  Constitutional:      General: He is not in acute distress.    Appearance: He is not ill-appearing, toxic-appearing or diaphoretic.  HENT:     Head: Normocephalic and atraumatic.     Jaw: There is normal jaw occlusion.     Right Ear: Tympanic membrane, ear canal and external ear normal. No decreased hearing noted. No drainage. There is no impacted cerumen. No hemotympanum. Tympanic membrane is not injected, scarred, perforated, erythematous, retracted or bulging.     Left Ear: Ear canal and external ear normal. No decreased hearing noted. No drainage. There is no impacted cerumen. No hemotympanum. Tympanic membrane is erythematous and bulging. Tympanic membrane is not injected, scarred, perforated or retracted.     Ears:     Comments: No Mastoid tenderness.    Nose:     Comments: No sinus tenderness.    Mouth/Throat:     Comments: Posterior oropharynx clear.  Mucous membranes moist.  Tonsils without erythema or exudate.  Uvula midline without deviation.  No evidence of PTA or RPA.  No drooling, dysphasia or trismus.  Phonation normal. Eyes:     Comments: No horizontal, vertical or rotational nystagmus   Neck:     Musculoskeletal: Full passive range of motion without pain and neck supple. Normal range of motion. No edema, erythema, neck rigidity, crepitus, injury, pain with movement, torticollis, spinous process tenderness or muscular tenderness.     Thyroid: No thyroid mass or thyromegaly.     Vascular: No carotid bruit or JVD.     Trachea: Trachea and phonation normal.     Meningeal: Brudzinski's sign and Kernig's sign absent.     Comments: Full active and passive ROM without pain No midline or paraspinal tenderness No nuchal rigidity or meningeal signs  No cervical lymphadenopathy. Cardiovascular:     Comments: No murmurs rubs or gallops. Pulmonary:     Comments: Clear to auscultation bilaterally without wheeze, rhonchi or rales.  No accessory muscle usage.  Able speak in  full sentences. Abdominal:     Comments: Soft, nontender without rebound or guarding.  No CVA tenderness.  Musculoskeletal:     Comments: Moves all 4 extremities without difficulty.  Lower extremities without edema, erythema or warmth.  Lymphadenopathy:     Cervical: No cervical adenopathy.     Right cervical: No superficial, deep or posterior cervical adenopathy.    Left cervical: No superficial, deep or posterior cervical adenopathy.  Skin:    Coloration: Skin is not jaundiced.     Findings: No abrasion, abscess, bruising, burn, erythema, signs of injury, laceration, lesion, petechiae, rash or wound. Rash is not urticarial or vesicular.     Comments: Brisk capillary refill.  No rashes or lesions.  Neurological:     Mental Status: He is alert.     Comments: Ambulatory in department without  difficulty. Cranial nerves II through XII grossly intact. Equal shoulder. No facial droop. No dysphasia.  Mental Status:  Alert, oriented, thought content appropriate. Speech fluent without evidence of aphasia. Able to follow 2 step commands without difficulty.  Cranial Nerves:  II:  Peripheral visual fields grossly normal, pupils equal, round, reactive to light III,IV, VI: ptosis not present, extra-ocular motions intact bilaterally  V,VII: smile symmetric, facial light touch sensation equal VIII: hearing grossly normal bilaterally  IX,X: midline uvula rise  XI: bilateral shoulder shrug equal and strong XII: midline tongue extension     ED Treatments / Results  Labs (all labs ordered are listed, but only abnormal results are displayed) Labs Reviewed - No data to display  EKG None  Radiology No results found.  Procedures Procedures (including critical care time)  Medications Ordered in ED Medications - No data to display  Initial Impression / Assessment and Plan / ED Course  I have reviewed the triage vital signs and the nursing notes.  Pertinent labs & imaging results that were  available during my care of the patient were reviewed by me and considered in my medical decision making (see chart for details).  28 year old male presents for evaluation of left ear pain.  Onset 1 week ago.  Afebrile, nonseptic, non-ill-appearing.  He did take amoxicillin which helped in the beginning, however he only had 3 pills of this and the pain has returned.  No drainage to canal.  No vesicular lesions in canal or on face. No mastoid tenderness. He does have erythematous and bulging left TM. Right ear normal.  Normal hearing. No neck stiffness or neck rigidity, no meningismus. Low suspicion for meningitis. He denies headache, facial pain or swelling.  Exam is consistent with otitis media.  No evidence of mastoiditis, meningitis or concern for possible carotid dissection as cause of pain. Normal neuro exam without HA, neuro complaints or deficets. Patient discharged home with ABX.  Hemodynamically stable and appropriate for DC at this time. I have also discussed reasons to return immediately to the ER.  Patient expressed understanding and agrees with plan.     Final Clinical Impressions(s) / ED Diagnoses   Final diagnoses:  Acute otitis media, unspecified otitis media type    ED Discharge Orders         Ordered    amoxicillin (AMOXIL) 500 MG capsule  3 times daily,   Status:  Discontinued     11/11/18 1119    amoxicillin-clavulanate (AUGMENTIN) 875-125 MG tablet  Every 12 hours     11/11/18 1133           Henderly, Britni A, PA-C 11/11/18 1140    Arby Barrette, MD 11/12/18 1110

## 2018-11-11 NOTE — ED Triage Notes (Signed)
Believes he has an ear infection, L ear pain since last Friday, took amoxicillin leftover from tooth extraction no releife

## 2018-11-11 NOTE — Discharge Instructions (Signed)
Your evaluated today for ear pain.  This is likely an infection on exam.  I have given you antibiotics.  Please take as prescribed.  I also suggest warm packs to your ear.  I would also like you to follow-up with a primary care provider early next week.  Return to the ED for any new or worsening symptoms.

## 2018-11-22 ENCOUNTER — Encounter (HOSPITAL_COMMUNITY): Payer: Self-pay | Admitting: Emergency Medicine

## 2018-11-22 ENCOUNTER — Other Ambulatory Visit: Payer: Self-pay

## 2018-11-22 ENCOUNTER — Emergency Department (HOSPITAL_COMMUNITY)
Admission: EM | Admit: 2018-11-22 | Discharge: 2018-11-22 | Disposition: A | Discharge status: Home / Self Care | Payer: Self-pay | Attending: Emergency Medicine | Admitting: Emergency Medicine

## 2018-11-22 DIAGNOSIS — K029 Dental caries, unspecified: Secondary | ICD-10-CM | POA: Insufficient documentation

## 2018-11-22 DIAGNOSIS — H9202 Otalgia, left ear: Secondary | ICD-10-CM | POA: Insufficient documentation

## 2018-11-22 DIAGNOSIS — F17210 Nicotine dependence, cigarettes, uncomplicated: Secondary | ICD-10-CM | POA: Insufficient documentation

## 2018-11-22 DIAGNOSIS — J45909 Unspecified asthma, uncomplicated: Secondary | ICD-10-CM | POA: Insufficient documentation

## 2018-11-22 DIAGNOSIS — Z79899 Other long term (current) drug therapy: Secondary | ICD-10-CM | POA: Insufficient documentation

## 2018-11-22 NOTE — Discharge Instructions (Signed)
Take over-the-counter pain medications as needed, follow-up with a dentist for further evaluation

## 2018-11-22 NOTE — ED Provider Notes (Signed)
[facility]-EMERGENCY DEPT Provider Note   CSN: [id number] Arrival date & time: 11/22/18  2234    History   Chief Complaint Chief Complaint  Patient presents with  . Otalgia    HPI Peter Greer is a 28 y.o. male.     HPI Pt presented to the ED for evaluation of ear pain.  Pt states he was diagnosed with  An ear infection a few weeks ago.  He took a course of abx and it seemed to improve slightly   However, it still has not gone away.  He continues to have pain in the left ear.  Sometimes it does increase with chewing.  He does have some dental pain.    No fevers/  No difficulty swallowing.  Past Medical History:  Diagnosis Date  . Asthma     There are no active problems to display for this patient.   History reviewed. No pertinent surgical history.      Home Medications    Prior to Admission medications   Medication Sig Start Date End Date Taking? Authorizing Provider  albuterol (PROVENTIL HFA;VENTOLIN HFA) 108 (90 BASE) MCG/ACT inhaler Inhale 2 puffs into the lungs every 4 (four) hours as needed for wheezing or shortness of breath. 12/24/14   Hayden Rasmussen, NP  albuterol (PROVENTIL) (2.5 MG/3ML) 0.083% nebulizer solution Take 3 mLs (2.5 mg total) by nebulization every 4 (four) hours as needed for wheezing or shortness of breath. 06/02/15   Antony Madura, PA-C  amoxicillin-clavulanate (AUGMENTIN) 875-125 MG tablet Take 1 tablet by mouth every 12 (twelve) hours. 11/11/18   Henderly, Britni A, PA-C  benzonatate (TESSALON) 100 MG capsule Take 1 capsule (100 mg total) by mouth every 8 (eight) hours. 06/02/15   Antony Madura, PA-C  cetirizine (ZYRTEC) 10 MG tablet Take 1 tablet (10 mg total) by mouth daily. 12/06/14   Charm Rings, MD  ciprofloxacin-dexamethasone (CIPRODEX) otic suspension Place 4 drops into the left ear 2 (two) times daily. For 7 days 12/06/14   Charm Rings, MD  diclofenac (VOLTAREN) 50 MG EC tablet Take 1 tablet (50 mg total) by mouth 2  (two) times daily. 07/07/16   Elson Areas, PA-C  fluticasone (FLONASE) 50 MCG/ACT nasal spray Place 2 sprays into both nostrils daily. 12/06/14   Charm Rings, MD  predniSONE (DELTASONE) 20 MG tablet Take 2 tablets (40 mg total) by mouth daily. 06/02/15   Antony Madura, PA-C    Family History No family history on file.  Social History Social History   Tobacco Use  . Smoking status: Current Every Day Smoker    Packs/day: 0.50  . Smokeless tobacco: Never Used  Substance Use Topics  . Alcohol use: No  . Drug use: No     Allergies   Patient has no known allergies.   Review of Systems Review of Systems  All other systems reviewed and are negative.    Physical Exam Updated Vital Signs BP (!) 149/106 (BP Location: Left Arm)   Pulse 95   Temp 98.5 F (36.9 C) (Oral)   Resp 18   Ht 1.753 m (5\' 9" )   Wt 122.5 kg   SpO2 99%   BMI 39.87 kg/m   Physical Exam Vitals signs and nursing note reviewed.  Constitutional:      General: He is not in acute distress.    Appearance: He is well-developed.  HENT:     Head: Normocephalic and atraumatic.     Right Ear: Tympanic membrane  and external ear normal.     Left Ear: Tympanic membrane and external ear normal.     Mouth/Throat:     Comments: Dental caries Eyes:     General: No scleral icterus.       Right eye: No discharge.        Left eye: No discharge.     Conjunctiva/sclera: Conjunctivae normal.  Neck:     Musculoskeletal: Neck supple.     Trachea: No tracheal deviation.  Cardiovascular:     Rate and Rhythm: Normal rate and regular rhythm.  Pulmonary:     Effort: Pulmonary effort is normal. No respiratory distress.     Breath sounds: Normal breath sounds. No stridor. No wheezing or rales.  Abdominal:     General: Bowel sounds are normal. There is no distension.     Palpations: Abdomen is soft.     Tenderness: There is no abdominal tenderness. There is no guarding or rebound.  Musculoskeletal:        General:  No tenderness.  Skin:    General: Skin is warm and dry.     Findings: No rash.  Neurological:     Mental Status: He is alert.     Cranial Nerves: No cranial nerve deficit (no facial droop, extraocular movements intact, no slurred speech).     Sensory: No sensory deficit.     Motor: No abnormal muscle tone or seizure activity.     Coordination: Coordination normal.      ED Treatments / Results   Procedures Procedures (including critical care time)  Medications Ordered in ED Medications - No data to display   Initial Impression / Assessment and Plan / ED Course  I have reviewed the triage vital signs and the nursing notes.  Pertinent labs & imaging results that were available during my care of the patient were reviewed by me and considered in my medical decision making (see chart for details).   Patient's exam is normal with exception of some dental caries.  He does not have any evidence of otitis media.  No evidence of otitis externa.  Recommend he follow-up with a dentist as his ear pain may be coming from that. Final Clinical Impressions(s) / ED Diagnoses   Final diagnoses:  Otalgia of left ear  Dental caries    ED Discharge Orders    None       Linwood DibblesKnapp, Neliah Cuyler, MD 11/22/18 2318

## 2018-11-22 NOTE — ED Notes (Signed)
Pt assessed and discharged by Roselyn Bering EDP.  Pt left without his papers.

## 2018-11-22 NOTE — ED Triage Notes (Signed)
Patient c/o left ear pain x3 weeks. Reports seen at [facility] on 4/3 and prescribed amoxicillin with no relief.
